# Patient Record
Sex: Male | Born: 1960 | Race: White | Hispanic: No | Marital: Single | Smoking: Current every day smoker
Health system: Southern US, Community
[De-identification: ages and names within clinical notes are randomized; demographics above are authoritative.]

## PROBLEM LIST (undated history)

## (undated) DIAGNOSIS — Z87442 Personal history of urinary calculi: Secondary | ICD-10-CM

## (undated) DIAGNOSIS — H3320 Serous retinal detachment, unspecified eye: Secondary | ICD-10-CM

## (undated) DIAGNOSIS — N200 Calculus of kidney: Secondary | ICD-10-CM

## (undated) DIAGNOSIS — I10 Essential (primary) hypertension: Secondary | ICD-10-CM

## (undated) HISTORY — DX: Essential (primary) hypertension: I10

## (undated) HISTORY — DX: Serous retinal detachment, unspecified eye: H33.20

## (undated) HISTORY — PX: COLONOSCOPY: SHX174

## (undated) HISTORY — PX: LITHOTRIPSY: SUR834

## (undated) HISTORY — DX: Calculus of kidney: N20.0

## (undated) HISTORY — PX: HERNIA REPAIR: SHX51

---

## 2002-11-13 ENCOUNTER — Encounter: Payer: Self-pay | Admitting: Emergency Medicine

## 2002-11-13 ENCOUNTER — Emergency Department (HOSPITAL_COMMUNITY): Admission: EM | Admit: 2002-11-13 | Discharge: 2002-11-13 | Payer: Self-pay | Admitting: Emergency Medicine

## 2008-11-14 ENCOUNTER — Ambulatory Visit (HOSPITAL_COMMUNITY): Admission: RE | Admit: 2008-11-14 | Discharge: 2008-11-14 | Payer: Self-pay | Admitting: Urology

## 2011-09-28 LAB — BASIC METABOLIC PANEL
BUN: 18
CO2: 30
Calcium: 9.5
Chloride: 106
Creatinine, Ser: 1.09
GFR calc Af Amer: >60
GFR calc non Af Amer: >60
Glucose, Bld: 108 — ABNORMAL HIGH
Potassium: 4.1
Sodium: 141

## 2012-06-14 ENCOUNTER — Encounter (HOSPITAL_COMMUNITY): Payer: Self-pay | Admitting: Emergency Medicine

## 2012-06-14 ENCOUNTER — Emergency Department (HOSPITAL_COMMUNITY): Payer: No Typology Code available for payment source

## 2012-06-14 ENCOUNTER — Inpatient Hospital Stay (HOSPITAL_COMMUNITY)
Admission: EM | Admit: 2012-06-14 | Discharge: 2012-06-19 | DRG: 083 | Disposition: A | Discharge status: Rehabilitation Facility | Payer: No Typology Code available for payment source | Attending: General Surgery | Admitting: General Surgery

## 2012-06-14 ENCOUNTER — Inpatient Hospital Stay (HOSPITAL_COMMUNITY): Payer: No Typology Code available for payment source

## 2012-06-14 DIAGNOSIS — S02109A Fracture of base of skull, unspecified side, initial encounter for closed fracture: Principal | ICD-10-CM | POA: Diagnosis present

## 2012-06-14 DIAGNOSIS — S06369A Traumatic hemorrhage of cerebrum, unspecified, with loss of consciousness of unspecified duration, initial encounter: Secondary | ICD-10-CM | POA: Diagnosis present

## 2012-06-14 DIAGNOSIS — R839 Unspecified abnormal finding in cerebrospinal fluid: Secondary | ICD-10-CM | POA: Diagnosis present

## 2012-06-14 DIAGNOSIS — S1093XA Contusion of unspecified part of neck, initial encounter: Secondary | ICD-10-CM

## 2012-06-14 DIAGNOSIS — I629 Nontraumatic intracranial hemorrhage, unspecified: Secondary | ICD-10-CM

## 2012-06-14 DIAGNOSIS — S065X9A Traumatic subdural hemorrhage with loss of consciousness of unspecified duration, initial encounter: Secondary | ICD-10-CM

## 2012-06-14 DIAGNOSIS — G9389 Other specified disorders of brain: Secondary | ICD-10-CM | POA: Diagnosis present

## 2012-06-14 DIAGNOSIS — S0003XA Contusion of scalp, initial encounter: Secondary | ICD-10-CM

## 2012-06-14 DIAGNOSIS — S0219XA Other fracture of base of skull, initial encounter for closed fracture: Secondary | ICD-10-CM

## 2012-06-14 DIAGNOSIS — F17200 Nicotine dependence, unspecified, uncomplicated: Secondary | ICD-10-CM | POA: Diagnosis present

## 2012-06-14 DIAGNOSIS — S12600A Unspecified displaced fracture of seventh cervical vertebra, initial encounter for closed fracture: Secondary | ICD-10-CM | POA: Diagnosis present

## 2012-06-14 DIAGNOSIS — R4182 Altered mental status, unspecified: Secondary | ICD-10-CM

## 2012-06-14 DIAGNOSIS — S41109A Unspecified open wound of unspecified upper arm, initial encounter: Secondary | ICD-10-CM

## 2012-06-14 DIAGNOSIS — T07XXXA Unspecified multiple injuries, initial encounter: Secondary | ICD-10-CM | POA: Diagnosis present

## 2012-06-14 DIAGNOSIS — S22009A Unspecified fracture of unspecified thoracic vertebra, initial encounter for closed fracture: Secondary | ICD-10-CM | POA: Diagnosis present

## 2012-06-14 DIAGNOSIS — S066X9A Traumatic subarachnoid hemorrhage with loss of consciousness of unspecified duration, initial encounter: Secondary | ICD-10-CM

## 2012-06-14 DIAGNOSIS — Y92410 Unspecified street and highway as the place of occurrence of the external cause: Secondary | ICD-10-CM

## 2012-06-14 DIAGNOSIS — E871 Hypo-osmolality and hyponatremia: Secondary | ICD-10-CM | POA: Diagnosis present

## 2012-06-14 DIAGNOSIS — S51009A Unspecified open wound of unspecified elbow, initial encounter: Secondary | ICD-10-CM | POA: Diagnosis present

## 2012-06-14 DIAGNOSIS — S0990XA Unspecified injury of head, initial encounter: Secondary | ICD-10-CM

## 2012-06-14 DIAGNOSIS — S0083XA Contusion of other part of head, initial encounter: Secondary | ICD-10-CM

## 2012-06-14 DIAGNOSIS — IMO0002 Reserved for concepts with insufficient information to code with codable children: Secondary | ICD-10-CM | POA: Diagnosis present

## 2012-06-14 LAB — DIFFERENTIAL
Lymphocytes Relative: 16 % (ref 12–46)
Lymphs Abs: 2.5 10*3/uL (ref 0.7–4.0)
Neutro Abs: 12 10*3/uL — ABNORMAL HIGH (ref 1.7–7.7)
Neutrophils Relative %: 78 % — ABNORMAL HIGH (ref 43–77)

## 2012-06-14 LAB — BASIC METABOLIC PANEL
CO2: 25 mEq/L (ref 19–32)
Chloride: 104 mEq/L (ref 96–112)
Glucose, Bld: 132 mg/dL — ABNORMAL HIGH (ref 70–99)
Potassium: 3.6 mEq/L (ref 3.5–5.1)
Sodium: 139 mEq/L (ref 135–145)

## 2012-06-14 LAB — URINE MICROSCOPIC-ADD ON

## 2012-06-14 LAB — RAPID URINE DRUG SCREEN, HOSP PERFORMED
Amphetamines: NOT DETECTED
Benzodiazepines: NOT DETECTED
Opiates: NOT DETECTED

## 2012-06-14 LAB — CBC
MCV: 90 fL (ref 78.0–100.0)
Platelets: 219 10*3/uL (ref 150–400)
RBC: 4.7 MIL/uL (ref 4.22–5.81)
WBC: 15.3 10*3/uL — ABNORMAL HIGH (ref 4.0–10.5)

## 2012-06-14 LAB — URINALYSIS, ROUTINE W REFLEX MICROSCOPIC
Glucose, UA: NEGATIVE mg/dL
Leukocytes, UA: NEGATIVE
Nitrite: NEGATIVE
Protein, ur: NEGATIVE mg/dL
pH: 7.5 (ref 5.0–8.0)

## 2012-06-14 LAB — MRSA PCR SCREENING: MRSA by PCR: NEGATIVE

## 2012-06-14 MED ORDER — DOCUSATE SODIUM 100 MG PO CAPS
100.0000 mg | ORAL_CAPSULE | Freq: Two times a day (BID) | ORAL | Status: DC
  Administered 2012-06-15 – 2012-06-19 (×8): 100 mg via ORAL
  Filled 2012-06-14 (×12): qty 1

## 2012-06-14 MED ORDER — POLYETHYLENE GLYCOL 3350 17 G PO PACK
17.0000 g | PACK | Freq: Every day | ORAL | Status: DC
  Administered 2012-06-15 – 2012-06-19 (×5): 17 g via ORAL
  Filled 2012-06-14 (×7): qty 1

## 2012-06-14 MED ORDER — IOHEXOL 300 MG/ML  SOLN
100.0000 mL | Freq: Once | INTRAMUSCULAR | Status: AC | PRN
  Administered 2012-06-14: 100 mL via INTRAVENOUS

## 2012-06-14 MED ORDER — ONDANSETRON HCL 4 MG/2ML IJ SOLN
INTRAMUSCULAR | Status: AC
  Administered 2012-06-14: 4 mg via INTRAVENOUS
  Filled 2012-06-14: qty 2

## 2012-06-14 MED ORDER — BACITRACIN-NEOMYCIN-POLYMYXIN OINTMENT TUBE
TOPICAL_OINTMENT | Freq: Every day | CUTANEOUS | Status: DC
  Administered 2012-06-14: 22:00:00 via TOPICAL
  Administered 2012-06-15 – 2012-06-16 (×2): 1 via TOPICAL
  Administered 2012-06-18: 10:00:00 via TOPICAL
  Filled 2012-06-14 (×2): qty 15

## 2012-06-14 MED ORDER — ONDANSETRON HCL 4 MG/2ML IJ SOLN
4.0000 mg | Freq: Four times a day (QID) | INTRAMUSCULAR | Status: DC | PRN
  Administered 2012-06-14 – 2012-06-17 (×2): 4 mg via INTRAVENOUS
  Filled 2012-06-14: qty 2

## 2012-06-14 MED ORDER — ONDANSETRON HCL 4 MG PO TABS: 4.0000 mg | ORAL_TABLET | Freq: Four times a day (QID) | ORAL | Status: DC | PRN

## 2012-06-14 MED ORDER — ACETAMINOPHEN 10 MG/ML IV SOLN
1000.0000 mg | Freq: Four times a day (QID) | INTRAVENOUS | Status: AC | PRN
  Administered 2012-06-14 – 2012-06-15 (×4): 1000 mg via INTRAVENOUS
  Filled 2012-06-14 (×3): qty 100

## 2012-06-14 MED ORDER — POTASSIUM CHLORIDE IN NACL 20-0.9 MEQ/L-% IV SOLN
INTRAVENOUS | Status: DC
  Administered 2012-06-14: 14:00:00 via INTRAVENOUS
  Administered 2012-06-15: 100 mL/h via INTRAVENOUS
  Administered 2012-06-15 – 2012-06-16 (×2): via INTRAVENOUS
  Administered 2012-06-17: 100 mL/h via INTRAVENOUS
  Administered 2012-06-17 – 2012-06-18 (×2): via INTRAVENOUS
  Filled 2012-06-14 (×13): qty 1000

## 2012-06-14 MED ORDER — CEFAZOLIN SODIUM 1-5 GM-% IV SOLN
1.0000 g | Freq: Three times a day (TID) | INTRAVENOUS | Status: DC
  Administered 2012-06-14 – 2012-06-18 (×11): 1 g via INTRAVENOUS
  Filled 2012-06-14 (×13): qty 50
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
## 2012-06-14 NOTE — ED Notes (Signed)
Kerry Castillo (daughter) [phone] or [phone]

## 2012-06-14 NOTE — ED Notes (Addendum)
Kerry Castillo [phone] pt sister

## 2012-06-14 NOTE — ED Notes (Signed)
See trauma notes

## 2012-06-14 NOTE — ED Notes (Signed)
Family updated as to patient's status.; pt placed in vista collar due to removing ccollar in CT
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
## 2012-06-14 NOTE — Progress Notes (Signed)
Patient has 4 to 5 cm laceration on his right elbow that appears to be deep and continually oozing small amounts of blood.  RN called Trauma team to report laceration might need stitches or staples.  Dr Lindie Spruce gave instructions to apply pressure dressing and Trauma would look at laceration in the morning.  Stephanie Coup, RN 06/14/2012  20:00
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
## 2012-06-15 ENCOUNTER — Inpatient Hospital Stay (HOSPITAL_COMMUNITY): Payer: No Typology Code available for payment source

## 2012-06-15 LAB — BASIC METABOLIC PANEL
GFR calc Af Amer: >90 mL/min (ref >90)
GFR calc non Af Amer: >90 mL/min (ref >90)
Potassium: 3.6 mEq/L (ref 3.5–5.1)
Sodium: 136 mEq/L (ref 135–145)

## 2012-06-15 LAB — CBC
Hemoglobin: 13.1 g/dL (ref 13.0–17.0)
MCHC: 35.8 g/dL (ref 30.0–36.0)
Platelets: 199 10*3/uL (ref 150–400)
RDW: 12.9 % (ref 11.5–15.5)

## 2012-06-15 MED ORDER — ACETAMINOPHEN 325 MG PO TABS
650.0000 mg | ORAL_TABLET | Freq: Four times a day (QID) | ORAL | Status: DC | PRN
  Administered 2012-06-15 – 2012-06-17 (×5): 650 mg via ORAL
  Filled 2012-06-15 (×5): qty 2

## 2012-06-15 MED ORDER — SODIUM CHLORIDE 0.9 % IV BOLUS (SEPSIS)
1000.0000 mL | Freq: Once | INTRAVENOUS | Status: AC
  Administered 2012-06-15: 1000 mL via INTRAVENOUS

## 2012-06-15 NOTE — Progress Notes (Signed)
Nursing 1400 Foley catheter did not produce any urine over 1 hour.  Bladder scan showed 33cc in bladder and RN irrigated catheter per order with NS 50cc.  Catheter did not produce urine after irrigation.  Dr. Janee Morn made aware that patient's catheter is not producing urine.  Order for NS 1L IV bolus.  Bolus started.  RN bladder scanned patient during bolus and found 220cc in bladder.  Dr. Janee Morn made aware.  Will continue bolus and not irrigated further at this time.  Will irrigate if needed s/p IV bolus.  Will continue to assess.
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
## 2012-06-15 NOTE — Progress Notes (Signed)
Nursing 1600 Upon assessment bladder catheter has no urine output.  Bladder scan revealed 421cc of urine in bladder.  NS bolus complete.  RN irrigated foley catheter with 120cc of NS per MD order.  Bladder catheter produced 495cc of urine after irrigation.  Will continue to assess.

## 2012-06-16 DIAGNOSIS — S02109A Fracture of base of skull, unspecified side, initial encounter for closed fracture: Secondary | ICD-10-CM | POA: Diagnosis present

## 2012-06-16 DIAGNOSIS — S065XAA Traumatic subdural hemorrhage with loss of consciousness status unknown, initial encounter: Secondary | ICD-10-CM | POA: Diagnosis present

## 2012-06-16 DIAGNOSIS — T07XXXA Unspecified multiple injuries, initial encounter: Secondary | ICD-10-CM | POA: Diagnosis present

## 2012-06-16 DIAGNOSIS — S065X9A Traumatic subdural hemorrhage with loss of consciousness of unspecified duration, initial encounter: Secondary | ICD-10-CM | POA: Diagnosis present

## 2012-06-16 DIAGNOSIS — S06369A Traumatic hemorrhage of cerebrum, unspecified, with loss of consciousness of unspecified duration, initial encounter: Secondary | ICD-10-CM | POA: Diagnosis present

## 2012-06-16 DIAGNOSIS — S069X9A Unspecified intracranial injury with loss of consciousness of unspecified duration, initial encounter: Secondary | ICD-10-CM

## 2012-06-16 LAB — BASIC METABOLIC PANEL
CO2: 23 mEq/L (ref 19–32)
GFR calc non Af Amer: >90 mL/min (ref >90)
Glucose, Bld: 112 mg/dL — ABNORMAL HIGH (ref 70–99)
Potassium: 3.6 mEq/L (ref 3.5–5.1)
Sodium: 132 mEq/L — ABNORMAL LOW (ref 135–145)

## 2012-06-16 LAB — CBC
Hemoglobin: 12.1 g/dL — ABNORMAL LOW (ref 13.0–17.0)
Platelets: 195 10*3/uL (ref 150–400)
RBC: 3.87 MIL/uL — ABNORMAL LOW (ref 4.22–5.81)
WBC: 14.1 10*3/uL — ABNORMAL HIGH (ref 4.0–10.5)
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
## 2012-06-16 NOTE — Plan of Care (Signed)
Problem: Phase III Progression Outcomes Goal: Ranchos Scale Level I - No Response Level II - Generalized Response Level III - Localized Response Level IV- Confused-Agitated Level V- Confused, Inappropriate, Non-Agitated Level VI- Confused-Appropriate Level VII- Automatic-Appropriate Level VIII- Purposeful-Appropriate  Outcome: Progressing Level V confused, inappropriate, non-agitated

## 2012-06-16 NOTE — Progress Notes (Signed)
I contacted pt's daughter, Aundra Millet, by phone. I will meet with 51 year old daughter and pt's sister, [name], on Monday at 11 am to discuss rehab venue options. Please call [phone] with questions.
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
## 2012-06-16 NOTE — Progress Notes (Signed)
Speech Language Pathology Treatment Patient Details Name: Kerry Castillo MRN: [id number] DOB: May 13, 1961 Today's Date: 06/16/2012 Time: 1191-4782 SLP Time Calculation (min): 20 min  Assessment / Plan / Recommendation Clinical Impression  Pt. seen in conjunction with TBI team (OT/PT).  Kerry Castillo exhibited increased expressive language difficutlies today with inconsistent accuracy with max verbal cueing.  Suspect pt. has more aphasia versus language of confusion and will continue to assess in diagnostic therapy.  Decreased sustanined attention due to headache and wanting to lie down.  Today pt. exhibited behaviors similar to Rancho V (confused/inappropriate/non-agitated) with some characteristics of Rancho VI (confused/appropriate)  Please see treatment section below for details of session    SLP Plan  Continue with current plan of care       SLP Goals  SLP Goals SLP Goal #1 - Progress: Progressing toward goal SLP Goal #2 - Progress: Progressing toward goal SLP Goal #3 - Progress: Progressing toward goal SLP Goal #4: Pt. will express basic thoughts and needs with mild phrase completion cues. SLP Goal #4 - Progress: Other (comment) (goal added 6/21)  General Behavior/Cognition: Alert;Confused;Distractible;Requires cueing  Oral Cavity - Oral Hygiene Does patient have any of the following "at risk" factors?: None of the above Brush patient's teeth BID with toothbrush (using toothpaste with fluoride): Yes   Treatment Treatment focused on: Cognition;Other (comment) (LANGUAGE FACILITATION) Skilled Treatment: Pt. seen today by brain injury team (PT/OT).  Pt. exhibiting increased difficulty with expressive language today with increased frequency of verbal perseverations and phonemic paraphasias with questionable neologisms.  Pt. correctly used common objects during ADL's (toothbrush, toothpaste, comb chapstick) but was unable to name them given phrase completion and phonemic cues.  He was  successful in stating first and last name given verbal phonemic cues.  Kerry Castillo was not oriented to place given choice of 2.  He accurately read one of three written words    Royce Macadamia M.Ed [employer] [phone]  06/16/2012
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
## 2012-06-16 NOTE — Plan of Care (Signed)
Problem: Phase III Progression Outcomes Goal: Progressing towards assisting with ADLs Outcome: Progressing Don socks, washed face, brushed teeth, combed hair, applied chapstick

## 2012-06-17 LAB — BASIC METABOLIC PANEL
BUN: 14 mg/dL (ref 6–23)
CO2: 24 mEq/L (ref 19–32)
Calcium: 8.8 mg/dL (ref 8.4–10.5)
Chloride: 96 mEq/L (ref 96–112)
Creatinine, Ser: 0.54 mg/dL (ref 0.50–1.35)
GFR calc Af Amer: >90 mL/min (ref >90)
GFR calc non Af Amer: >90 mL/min (ref >90)
Glucose, Bld: 95 mg/dL (ref 70–99)
Potassium: 3.7 mEq/L (ref 3.5–5.1)
Sodium: 130 mEq/L — ABNORMAL LOW (ref 135–145)

## 2012-06-17 MED ORDER — HYDROCODONE-ACETAMINOPHEN 10-325 MG PO TABS: 1.0000 | ORAL_TABLET | Freq: Four times a day (QID) | ORAL | Status: DC | PRN

## 2012-06-17 MED ORDER — HYDROCODONE-ACETAMINOPHEN 5-325 MG PO TABS
1.0000 | ORAL_TABLET | ORAL | Status: DC | PRN
  Administered 2012-06-17 (×2): 1 via ORAL
  Administered 2012-06-17: 2 via ORAL
  Administered 2012-06-17 – 2012-06-18 (×3): 1 via ORAL
  Administered 2012-06-18: 2 via ORAL
  Administered 2012-06-18: 1 via ORAL
  Administered 2012-06-18: 2 via ORAL
  Administered 2012-06-18 – 2012-06-19 (×2): 1 via ORAL
  Administered 2012-06-19: 2 via ORAL
  Administered 2012-06-19: 1 via ORAL
  Filled 2012-06-17: qty 2
  Filled 2012-06-17 (×4): qty 1
  Filled 2012-06-17: qty 2
  Filled 2012-06-17 (×2): qty 1
  Filled 2012-06-17 (×2): qty 2
  Filled 2012-06-17 (×3): qty 1

## 2012-06-17 NOTE — Progress Notes (Signed)
Patient ID: Kerry Castillo, male   DOB: 03/21/1961, 51 y.o.   MRN: [id number] Trauma Service Note  Subjective: The patient is more alert today according to family and nursing.    Objective: Vital signs in last 24 hours: Temp:  [97.6 F (36.4 C)-99.5 F (37.5 C)] 97.6 F (36.4 C) (06/22 0400) Pulse Rate:  [58-80] 72  (06/22 0800) Resp:  [8-21] 16  (06/22 0800) BP: (128-156)/(72-100) 140/87 mmHg (06/22 0800) SpO2:  [97 %-99 %] 99 % (06/22 0800) Last BM Date: 06/14/12  Intake/Output from previous day: 06/21 0701 - 06/22 0700 In: 2870 [P.O.:120; I.V.:2600; IV Piggyback:150] Out: 2430 [Urine:2430] Intake/Output this shift:    General: No acute distress, only some verbal interaction with me.    Lungs: Clear  CV:  RR&R  Abd: Soft, taking PO well, but not a lot, non distended.    Extremities: moving all extremities spontaneously.    Neuro: Followed some commands for me.    Lab Results: CBC   Basename 06/16/12 0430 06/15/12 0520  WBC 14.1* 15.6*  HGB 12.1* 13.1  HCT 34.1* 36.6*  PLT 195 199   BMET  Basename 06/16/12 0430 06/15/12 0520  NA 132* 136  K 3.6 3.6  CL 99 102  CO2 23 23  GLUCOSE 112* 128*  BUN 12 17  CREATININE 0.58 0.68  CALCIUM 8.7 8.8   PT/INR No results found for this basename: LABPROT:2,INR:2 in the last 72 hours ABG No results found for this basename: PHART:2,PCO2:2,PO2:2,HCO3:2 in the last 72 hours  Studies/Results: No results found.  Anti-infectives: Anti-infectives     Start     Dose/Rate Route Frequency Ordered Stop   06/14/12 2200   ceFAZolin (ANCEF) IVPB 1 g/50 mL premix        1 g 100 mL/hr over 30 Minutes Intravenous 3 times per day 06/14/12 2103            Assessment/Plan: TBI, C7 fx Overall clinical improvement.   D/C foley today.  Pt pulled with balloon up 3 days ago, was reinserted.  Will try to see if voids spontaneously. Add vicodin for pain control.  Now that more alert, will allow narcotics.   Checking BMET to  eval hyponatremia.   .Still needs ICU monitoring. Advance to full liquid diet.   Working toward rehab. Dispo from ICU per neurosurgery.   Getting repeat head CT in AM/    LOS: 3 days   \

## 2012-06-17 NOTE — Progress Notes (Signed)
Physical Therapy Treatment Patient Details Name: Kerry Castillo MRN: [id number] DOB: [birth year] Today's Date: 06/17/2012 Time: 0865-7846 PT Time Calculation (min): 29 min  PT Assessment / Plan / Recommendation Comments on Treatment Session  Pt repeatedly stating "I'm not right, this is not straight" pointing to head. Pt also pulls on cervical collar stating "And what are we going to do about this?" "I want this away." While sitting EOB pt able to ask "what are we going to do about this?" circling hand over groin area, when asked if he needed to urinate he stated "Yes" demonstrating improved ability to verbalize needs. Pt demonstrating significant short term memory deficits and impaired safety awareness throughout session. Pt did report that his head was spinning when asked however still with no nystagmus. Pt wonderful candidate for CIR.     Follow Up Recommendations  Inpatient Rehab;Supervision/Assistance - 24 hour       Equipment Recommendations  Defer to next venue    Recommendations for Other Services Rehab consult  Frequency Min 4X/week   Plan Discharge plan needs to be updated;Frequency remains appropriate    Precautions / Restrictions Precautions Precautions: Cervical;Fall Required Braces or Orthoses: Cervical Brace Cervical Brace: Hard collar;Other (comment) (at all times) Restrictions Weight Bearing Restrictions: No       Mobility  Bed Mobility Bed Mobility: Sitting - Scoot to Edge of Bed;Rolling Right;Right Sidelying to Sit Rolling Right: 4: Min assist Right Sidelying to Sit: 4: Min assist Sitting - Scoot to Edge of Bed: 5: Supervision Details for Bed Mobility Assistance: Pt responds best to single step cues, needs tactile cues for initiation. Pt very slow to process task at hand, unable to follow two step commands such as roll over and sit up.  Transfers Transfers: Sit to Stand;Stand to Sit Sit to Stand: 4: Min assist;With upper extremity assist;From bed Stand to  Sit: 4: Min guard;Without upper extremity assist;To chair/3-in-1 Details for Transfer Assistance: Lateral instability with initial stand, pt attempts to sit right away and requires cues to remember to stay standing to walk.  Ambulation/Gait Ambulation/Gait Assistance: 4: Min assist Ambulation Distance (Feet): 150 Feet (with one seated rest break) Assistive device: None (will at times hold onto IV pole for stability.) Ambulation/Gait Assistance Details: Pt able to determine Rt./Lt and turn appropriately. Pt again asking to return to bed however was able to postpone questions until near end of walk. Increased sway with fatigue, pt unable to verbalize when he needed to sit for rest and needed to be asked. Pt able to scan and count people in nurses station with one error, able to correct with verbal cues. +1 person to follow as pt with decreased ability to verbalize when fatigued. Gait Pattern: Narrow base of support Stairs: No      PT Goals Acute Rehab PT Goals Pt will go Supine/Side to Sit: with modified independence PT Goal: Supine/Side to Sit - Progress: Progressing toward goal Pt will go Sit to Stand: with modified independence PT Goal: Sit to Stand - Progress: Progressing toward goal Pt will go Stand to Sit: with modified independence PT Goal: Stand to Sit - Progress: Progressing toward goal Pt will Transfer Bed to Chair/Chair to Bed: with supervision PT Transfer Goal: Bed to Chair/Chair to Bed - Progress: Progressing toward goal Pt will Ambulate: >150 feet;with supervision;with least restrictive assistive device PT Goal: Ambulate - Progress: Progressing toward goal Pt will Go Up / Down Stairs: 1-2 stairs;with supervision;with least restrictive assistive device PT Goal: Up/Down Stairs - Progress: Progressing  toward goal Additional Goals Additional Goal #1: Pt will demonstrate ability to selectively attend to tasks/commands in a moderately distracting environment (hospital hallway) PT  Goal: Additional Goal #1 - Progress: Progressing toward goal  Visit Information  Last PT Received On: 06/17/12 Assistance Needed: +1 (+2 for safety)    Subjective Data  Subjective: Pt keeps stating "what are we going to do about this" and points to his cervical collar. Patient Stated Goal: Feel "straight" again   Cognition  Overall Cognitive Status: Impaired Area of Impairment: Attention;Memory;Following commands;Safety/judgement;Awareness of errors;Awareness of deficits;Problem solving;[place] level Arousal/Alertness: Awake/alert Orientation Level: Disoriented X4 Behavior During Session: Flat affect Current Attention Level: Sustained Memory Deficits: Pt repeats concern for why cervical brace donned, unable to remember explanation or what brace is. Short term memory significantly impaired. Following Commands: Follows one step commands with increased time Safety/Judgement: Decreased awareness of safety precautions;Decreased safety judgement for tasks assessed;Decreased awareness of need for assistance Safety/Judgement - Other Comments: Pt follows safety commands however requires these commands to be safe. Awareness of Errors: Assistance required to identify errors made;Assistance required to correct errors made    Balance  Balance Balance Assessed: Yes Static Standing Balance Static Standing - Balance Support: No upper extremity supported Static Standing - Level of Assistance: 5: Stand by assistance Static Standing - Comment/# of Minutes: Pt able to stand and urinate without, no overt losses of balance.  End of Session PT - End of Session Equipment Utilized During Treatment: Gait belt;Cervical collar Activity Tolerance: Other (comment) (Limited by decr attention span, concern about head and neck) Patient left: in chair (with sitter) Nurse Communication: Mobility status (\)    Wilhemina Bonito 06/17/2012, 5:22 PM  Sherie Don) Carleene Mains PT, DPT Acute  Rehabilitation [phone]

## 2012-06-17 NOTE — Progress Notes (Signed)
Subjective: Patient awakens more easily today. Overall more responsive.   Objective: Vital signs in last 24 hours: Filed Vitals:   06/17/12 0400 06/17/12 0500 06/17/12 0600 06/17/12 0700  BP: 128/76 156/93 135/83 154/99  Pulse: 62 60 68 58  Temp: 97.6 F (36.4 C)     TempSrc: Oral     Resp: 18  20 19   Height:      Weight:      SpO2: 97% 98% 97% 97%    Intake/Output from previous day: 06/21 0701 - 06/22 0700 In: 2870 [P.O.:120; I.V.:2600; IV Piggyback:150] Out: 2430 [Urine:2430] Intake/Output this shift:    Physical Exam:  Slowly oriented to name and hospital, but not to year. Following commands somewhat better today, but still difficulty with slightly less simple commands (such as holding up 2 fingers). Moving all 4 extremities well. Extraocular movements intact. Pupils equal round and reactive to light and approximately 3 mm bilaterally. Facial movements symmetrical.  CBC  Basename 06/16/12 0430 06/15/12 0520  WBC 14.1* 15.6*  HGB 12.1* 13.1  HCT 34.1* 36.6*  PLT 195 199   BMET  Basename 06/16/12 0430 06/15/12 0520  NA 132* 136  K 3.6 3.6  CL 99 102  CO2 23 23  GLUCOSE 112* 128*  BUN 12 17  CREATININE 0.58 0.68  CALCIUM 8.7 8.8    Assessment/Plan: BMET not checked this a.m. despite hyponatremia yesterday. We'll check BMET now and again a.m. Followup CT not done today, will order for a.m. Being considered for comprehensive inpatient rehabilitation, clearly an appropriate candidate for this. Spoke with the patient's sister [name] and patient's daughter Aundra Millet about his condition and progress. Their questions were answered.   Hewitt Shorts, MD 06/17/2012, 7:52 AM

## 2012-06-18 ENCOUNTER — Inpatient Hospital Stay (HOSPITAL_COMMUNITY): Payer: No Typology Code available for payment source

## 2012-06-18 LAB — BASIC METABOLIC PANEL
BUN: 14 mg/dL (ref 6–23)
CO2: 23 mEq/L (ref 19–32)
Calcium: 8.8 mg/dL (ref 8.4–10.5)
Chloride: 96 mEq/L (ref 96–112)
Creatinine, Ser: 0.6 mg/dL (ref 0.50–1.35)
GFR calc Af Amer: >90 mL/min (ref >90)
GFR calc non Af Amer: >90 mL/min (ref >90)
Glucose, Bld: 96 mg/dL (ref 70–99)
Potassium: 3.5 mEq/L (ref 3.5–5.1)
Sodium: 130 mEq/L — ABNORMAL LOW (ref 135–145)

## 2012-06-18 NOTE — Progress Notes (Signed)
Patient ID: Kerry Castillo, male   DOB: January 22, 1961, 51 y.o.   MRN: [id number] Patient looks better today. He is more awake and conversant. He has no pronator drift. No facial asymmetry. He states he has no headache today. No nausea and vomiting. No diplopia. He states he feels a lot better today than yesterday. He is oriented to person place and situation. CT scan today look stable with expected evolution of his bitemporal contusions. Basal cisterns are open. Overall I think he is doing better. He seems to be progressing nicely.
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
## 2012-06-19 ENCOUNTER — Inpatient Hospital Stay (HOSPITAL_COMMUNITY): Payer: No Typology Code available for payment source

## 2012-06-19 ENCOUNTER — Inpatient Hospital Stay (HOSPITAL_COMMUNITY)
Admission: RE | Admit: 2012-06-19 | Discharge: 2012-06-23 | DRG: 945 | Disposition: A | Discharge status: Home / Self Care | Payer: No Typology Code available for payment source | Source: Ambulatory Visit | Attending: Physical Medicine & Rehabilitation | Admitting: Physical Medicine & Rehabilitation

## 2012-06-19 DIAGNOSIS — H9210 Otorrhea, unspecified ear: Secondary | ICD-10-CM | POA: Diagnosis present

## 2012-06-19 DIAGNOSIS — S22009A Unspecified fracture of unspecified thoracic vertebra, initial encounter for closed fracture: Secondary | ICD-10-CM | POA: Diagnosis present

## 2012-06-19 DIAGNOSIS — Z5189 Encounter for other specified aftercare: Principal | ICD-10-CM

## 2012-06-19 DIAGNOSIS — E871 Hypo-osmolality and hyponatremia: Secondary | ICD-10-CM | POA: Diagnosis present

## 2012-06-19 DIAGNOSIS — Y998 Other external cause status: Secondary | ICD-10-CM

## 2012-06-19 DIAGNOSIS — S069X9A Unspecified intracranial injury with loss of consciousness of unspecified duration, initial encounter: Secondary | ICD-10-CM

## 2012-06-19 DIAGNOSIS — S02109A Fracture of base of skull, unspecified side, initial encounter for closed fracture: Secondary | ICD-10-CM | POA: Diagnosis present

## 2012-06-19 DIAGNOSIS — G809 Cerebral palsy, unspecified: Secondary | ICD-10-CM | POA: Diagnosis present

## 2012-06-19 DIAGNOSIS — S12600A Unspecified displaced fracture of seventh cervical vertebra, initial encounter for closed fracture: Secondary | ICD-10-CM | POA: Diagnosis present

## 2012-06-19 LAB — BASIC METABOLIC PANEL
CO2: 25 mEq/L (ref 19–32)
Calcium: 8.8 mg/dL (ref 8.4–10.5)
Creatinine, Ser: 0.62 mg/dL (ref 0.50–1.35)
GFR calc non Af Amer: >90 mL/min (ref >90)
Sodium: 130 mEq/L — ABNORMAL LOW (ref 135–145)

## 2012-06-19 MED ORDER — SORBITOL 70 % SOLN: 30.0000 mL | Freq: Every day | Status: DC | PRN

## 2012-06-19 MED ORDER — ONDANSETRON HCL 4 MG/2ML IJ SOLN: 4.0000 mg | Freq: Four times a day (QID) | INTRAMUSCULAR | Status: DC | PRN

## 2012-06-19 MED ORDER — ONDANSETRON HCL 4 MG PO TABS: 4.0000 mg | ORAL_TABLET | Freq: Four times a day (QID) | ORAL | Status: DC | PRN

## 2012-06-19 MED ORDER — SENNOSIDES-DOCUSATE SODIUM 8.6-50 MG PO TABS: 1.0000 | ORAL_TABLET | Freq: Every evening | ORAL | Status: DC | PRN

## 2012-06-19 MED ORDER — POLYETHYLENE GLYCOL 3350 17 G PO PACK
17.0000 g | PACK | Freq: Every day | ORAL | Status: DC
  Administered 2012-06-19 – 2012-06-23 (×5): 17 g via ORAL
  Filled 2012-06-19 (×6): qty 1

## 2012-06-19 MED ORDER — ACETAMINOPHEN 325 MG PO TABS
325.0000 mg | ORAL_TABLET | ORAL | Status: DC | PRN
Start: 2012-06-19 — End: 2012-06-23
  Administered 2012-06-23: 650 mg via ORAL
  Filled 2012-06-19: qty 2

## 2012-06-19 MED ORDER — BACITRACIN-NEOMYCIN-POLYMYXIN OINTMENT TUBE
TOPICAL_OINTMENT | Freq: Every day | CUTANEOUS | Status: DC
  Administered 2012-06-20 – 2012-06-23 (×4): via TOPICAL
  Filled 2012-06-19: qty 15

## 2012-06-19 MED ORDER — HYDROCODONE-ACETAMINOPHEN 5-325 MG PO TABS
1.0000 | ORAL_TABLET | ORAL | Status: DC | PRN
  Administered 2012-06-19 – 2012-06-20 (×3): 2 via ORAL
  Administered 2012-06-20: 1 via ORAL
  Administered 2012-06-21: 2 via ORAL
  Administered 2012-06-21: 1 via ORAL
  Administered 2012-06-21 (×2): 2 via ORAL
  Administered 2012-06-21 – 2012-06-22 (×3): 1 via ORAL
  Administered 2012-06-22: 2 via ORAL
  Filled 2012-06-19 (×5): qty 2
  Filled 2012-06-19 (×2): qty 1
  Filled 2012-06-19 (×2): qty 2
  Filled 2012-06-19 (×4): qty 1
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
## 2012-06-19 NOTE — Progress Notes (Signed)
I met with patient, daughter, and sister at bedside. Patient progressing well. I wait insurance decision on admitting patient to inpt rehab today. Patient and family in agreement. I have discussed with Trauma PA. I will follow up today as bed is available today. [phone] with questions.
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
## 2012-06-19 NOTE — Plan of Care (Signed)
Overall Plan of Care [facility]) Patient Details Name: Kerry Castillo MRN: [id number] DOB: 10-Nov-1961  Diagnosis:  TBI  Primary Diagnosis:    TBI (traumatic brain injury) Co-morbidities: hearing loss, skull fx  Functional Problem List  Patient demonstrates impairments in the following areas: Balance, Behavior, Bladder, Bowel, Cognition and Endurance  Basic ADL's: eating, grooming, bathing, dressing and toileting Advanced ADL's: simple meal preparation and laundry  Transfers:  bed to chair, car, furniture and floor Locomotion:  ambulation and stairs  Additional Impairments:  Leisure Awareness  Anticipated Outcomes Item Anticipated Outcome  Eating/Swallowing  Independent  Basic self-care  Intermittent supervision  Tolieting  Mod independence  Bowel/Bladder  Mod independence  Transfers  Mod I  Locomotion  Mod I 150'  Communication  Supervision  Cognition  Supervision  Pain  Min assist   Safety/Judgment  Supervision  Other     Therapy Plan: PT Frequency: 2-3 X/day, 60-90 minutes OT Frequency: 1-2 X/day, 60-90 minutes SLP Frequency: 1-2 X/day, 30-60 minutes   Team Interventions: Item RN PT OT SLP SW TR Other  Self Care/Advanced ADL Retraining   x      Neuromuscular Re-Education         Therapeutic Activities  x x x     UE/LE Strength Training/ROM  x x      UE/LE Coordination Activities  x x      Visual/Perceptual Remediation/Compensation         DME/Adaptive Equipment Instruction         Therapeutic Exercise  x x      Balance/Vestibular Training  x x      Patient/Family Education x x x x     Cognitive Remediation/Compensation  x x x     Functional Mobility Training  x x      Ambulation/Gait Training  x       Furniture conservator/restorer Reintegration  x x      Dysphagia/Aspiration Landscape architect Facilitation    x     Bladder Management  x        Bowel Management x        Disease Management/Prevention x        Pain Management x x x      Medication Management x        Skin Care/Wound Management x  x      Splinting/Orthotics         Discharge Planning  x x x x    Psychosocial Support    x x                       Team Discharge Planning: Destination:  Home Projected Follow-up:  OT, SLP and Outpatient Projected Equipment Needs:  None Patient/family involved in discharge planning:  Yes  MD ELOS: 5--7 days Medical Rehab Prognosis:  Excellent Assessment: Pt admitted for cir therapies. Goals are mod I to supervision. Team is addressing mobility, self-care, cognition, safety, swallowing. Communication. Family is quite involved
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
## 2012-06-19 NOTE — Progress Notes (Signed)
Insurance has approved and pt to be admitted to CIR today.

## 2012-06-19 NOTE — Progress Notes (Signed)
UR complete 

## 2012-06-19 NOTE — Progress Notes (Signed)
Report called to Angie, RN on 4000. Patient transferred to CIR with no signs of acute distress.

## 2012-06-19 NOTE — Progress Notes (Signed)
Subjective: Patient resting in bed comfortably. Denies complaints.  Objective: Vital signs in last 24 hours: Filed Vitals:   06/19/12 0400 06/19/12 0500 06/19/12 0600 06/19/12 0700  BP: 111/65 148/100 141/86 141/67  Pulse: 69 59 65 64  Temp: 98.2 F (36.8 C)     TempSrc: Oral     Resp: 12 15 14 12   Height:      Weight:      SpO2: 99% 96% 95% 100%    Intake/Output from previous day: 06/23 0701 - 06/24 0700 In: 1200 [I.V.:1200] Out: 1650 [Urine:1650] Intake/Output this shift:    Physical Exam:  Patient awake and alert, oriented to his name and 2013 but identified place as "[employer]."  Following commands with all 4 extremities. Pupils equal round and reactive to light. Extraocular movements intact. Face symmetrical.  BMET  Basename 06/19/12 0422 06/18/12 0451  NA 130* 130*  K 3.5 3.5  CL 97 96  CO2 25 23  GLUCOSE 101* 96  BUN 14 14  CREATININE 0.62 0.60  CALCIUM 8.8 8.8    Studies/Results: Ct Head Wo Contrast  06/18/2012  *RADIOLOGY REPORT*  Clinical Data: Traumatic intracranial hemorrhage and basilar skull fractures - follow-up.  CT HEAD WITHOUT CONTRAST  Technique:  Contiguous axial images were obtained from the base of the skull through the vertex without contrast.  Comparison: 06/15/2012  Findings: Bitemporal hemorrhagic contusions are unchanged, left greater than right, with moderate associated edema on the left. A 5 mm left frontal subdural hematoma, right middle cranial fossa subdural hematoma and blood layering on the tentorium are unchanged. Again noted is mass effect on the left lateral ventricle without midline shift. There is no evidence of hydrocephalus and the basilar cisterns are patent.  No new areas of hemorrhage or acute infarction are identified.  Basilar skull fractures are again identified involving the parietal, mastoid / temporal bones and sphenoid bones.  Fluid in the sphenoid sinuses and mastoid air cells again noted. Bilateral scalp hematomas are  again noted.  IMPRESSION: Stable head CT with unchanged intracranial hemorrhage and skull fractures.  Original Report Authenticated By: Rosendo Gros, M.D.    Assessment/Plan: Neurologically more responsive, more oriented, and overall improving neurologically. CT scan of the brain yesterday shows expected continued evolution of left temporal hemorrhagic contusion. There is persistence of thin left frontal and bilateral sub-temporal subdural hematomas. Significant hyponatremia persists, sodium 130 this morning. We'll change IV to saline lock. We'll need repeat CT brain without towards the end of the week. Continue PT and OT, awaiting CIR.   Hewitt Shorts, MD 06/19/2012, 7:21 AM

## 2012-06-20 DIAGNOSIS — S069X9A Unspecified intracranial injury with loss of consciousness of unspecified duration, initial encounter: Secondary | ICD-10-CM

## 2012-06-20 DIAGNOSIS — S069XAA Unspecified intracranial injury with loss of consciousness status unknown, initial encounter: Secondary | ICD-10-CM

## 2012-06-20 DIAGNOSIS — Z5189 Encounter for other specified aftercare: Secondary | ICD-10-CM

## 2012-06-20 LAB — DIFFERENTIAL
Basophils Absolute: 0 10*3/uL (ref 0.0–0.1)
Basophils Relative: 0 % (ref 0–1)
Eosinophils Absolute: 0.1 10*3/uL (ref 0.0–0.7)
Eosinophils Relative: 1 % (ref 0–5)
Lymphocytes Relative: 15 % (ref 12–46)

## 2012-06-20 LAB — CBC
Hemoglobin: 13.6 g/dL (ref 13.0–17.0)
MCH: 31.4 pg (ref 26.0–34.0)
MCV: 87.1 fL (ref 78.0–100.0)
RBC: 4.33 MIL/uL (ref 4.22–5.81)

## 2012-06-20 LAB — COMPREHENSIVE METABOLIC PANEL
ALT: 57 U/L — ABNORMAL HIGH (ref 0–53)
CO2: 28 mEq/L (ref 19–32)
Calcium: 9.3 mg/dL (ref 8.4–10.5)
GFR calc Af Amer: >90 mL/min (ref >90)
GFR calc non Af Amer: >90 mL/min (ref >90)
Glucose, Bld: 103 mg/dL — ABNORMAL HIGH (ref 70–99)
Sodium: 132 mEq/L — ABNORMAL LOW (ref 135–145)
Total Bilirubin: 2.4 mg/dL — ABNORMAL HIGH (ref 0.3–1.2)

## 2012-06-20 MED ORDER — ENSURE COMPLETE PO LIQD
237.0000 mL | Freq: Two times a day (BID) | ORAL | Status: DC
  Administered 2012-06-20 – 2012-06-22 (×5): 237 mL via ORAL

## 2012-06-20 NOTE — Progress Notes (Signed)
Physical Therapy Session Note  Patient Details  Name: ALFREDO SPONG MRN: [id number] Date of Birth: Mar 03, 1961  Today's Date: 06/20/2012 Time: 4098-1191 Time Calculation (min): 33 min  Short Term Goals: Week 1:  PT Short Term Goal 1 (Week 1): Same as LTGs  Skilled Therapeutic Interventions/Progress Updates:    Session focused on problem solving and fine motor tasks. Pt performed ambulation x >300' with supervision on unit while pushing wheelchair to "work" on it as well as retrieving parts and looking for replacement leg rests. Pt able to recall appropriate tools for task and find appropriate size (phillips head screw driver/ socket and handle) with 2 verbal cues. Pt requires min verbal cues for efficiency and problem solving through adjusting brakes and working on seat. Pt still with some word finding difficulties.     Therapy Documentation Precautions:  Precautions Precautions: Fall;Other (comment) (right elbow, staples, keep dry) Cervical Brace: Other (comment) (C-Collar discontinued) Restrictions Weight Bearing Restrictions: No Pain: Pain Assessment Pain Assessment: No/denies pain   See FIM for current functional status  Therapy/Group: Individual Therapy  Wilhemina Bonito 06/20/2012, 5:22 PM
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
## 2012-06-20 NOTE — Progress Notes (Signed)
Patient noted with bloody drainage on pillow this am. Left ear drainage noted .attempted to clean old dried drainage from left ear. Packed left ear with cotton to assess new drainage . No new drainage noted this pm . Right elbow wrapped per order staples intact . Bacitracin applied to abrasions to forehead. vicodin 2 managing pain in head . Family at bedside throughout day . Continue with plan of care .                      Kerry Castillo
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
## 2012-06-20 NOTE — Progress Notes (Signed)
Patient information reviewed and entered into [facility] system by Sabian Kuba, RN, CRRN, PPS Coordinator.  Information including medical coding and functional independence measure will be reviewed and updated through discharge.    

## 2012-06-20 NOTE — Care Management (Signed)
[facility] Individual Statement of Services  Patient Name:  Kerry Castillo  Date:  06/20/2012  Welcome to the [facility].  Our goal is to provide you with an individualized program based on your diagnosis and situation, designed to meet your specific needs.  With this comprehensive rehabilitation program, you will be expected to participate in at least 3 hours of rehabilitation therapies Monday-Friday, with modified therapy programming on the weekends.  Your rehabilitation program will include the following services:  Physical Therapy (PT), Occupational Therapy (OT), Speech Therapy (ST), 24 hour per day rehabilitation nursing, Therapeutic Recreaction (TR), Case Management (RN and Child psychotherapist), Rehabilitation Medicine, Nutrition Services and Pharmacy Services  Weekly team conferences will be held on  Tuesday  to discuss your progress.  Your RN Case Designer, television/film set will talk with you frequently to get your input and to update you on team discussions.  Team conferences with you and your family in attendance may also be held.  Expected length of stay: 5-7 days   Overall anticipated outcome: Supervision  Depending on your progress and recovery, your program may change.   Your RN Case Estate agent will coordinate services and will keep you informed of any changes.  Your RN Sports coach and SW names and contact numbers are listed  below.   The following services may also be recommended but are not provided by the [facility]:   Driving Evaluations  Home Health Rehabiltiation Services  Outpatient Rehabilitatation [facility]  Vocational Rehabilitation   Arrangements will be made to provide these services after discharge if needed.  Arrangements include referral to agencies that provide these services.  Your insurance has been verified to be:  BCBS Your primary doctor is:    Pertinent information will be  shared with your doctor and your insurance company.  Case Manager: Melanee Spry, [facility] [phone]  Social Worker:  [place], [place] [phone]  Information discussed with and copy given to patient by: Brock Ra, 06/20/2012, 12:26 PM
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
## 2012-06-21 MED ORDER — TETRAHYDROZOLINE HCL 0.05 % OP SOLN
2.0000 [drp] | Freq: Three times a day (TID) | OPHTHALMIC | Status: DC
  Administered 2012-06-21 (×2): 2 [drp] via OPHTHALMIC
  Filled 2012-06-21: qty 15

## 2012-06-21 NOTE — Progress Notes (Signed)
Occupational Therapy Session Note  Patient Details  Name: CASMERE HOLLENBECK MRN: [id number] Date of Birth: 28-Jan-1961  Today's Date: 06/21/2012 Time: 8295-6213 Time Calculation (min):   Skilled Therapeutic Interventions/Progress Updates:    Pt engaged in ADL retraining (bathing, dressing, toileting, grooming) with focus on safety awareness and dynamic standing balance.  Pt initiated and sequenced all tasks independently.  Pt donned underpants and pants while standing exhibiting LOB but able to self-correct.  Pt stated that he should have sat down to thread his pants legs.  Pt completed bathing while standing in shower with no LOB noted.  Pt completed all grooming tasks while standing at sink.  Therapy Documentation Precautions:  Precautions Precautions: Fall;Other (comment) (right elbow, staples, keep dry) Cervical Brace: Other (comment) (C-Collar discontinued) Restrictions Weight Bearing Restrictions: No   Pain:  Pt denies pain  See FIM for current functional status  Therapy/Group: Individual Therapy  Rich Brave 06/21/2012, 12:21 PM

## 2012-06-21 NOTE — Progress Notes (Signed)
Speech Language Pathology Daily Session Note  Patient Details  Name: Kerry Castillo MRN: [id number] Date of Birth: 10/05/1961  Today's Date: 06/21/2012 Time: 1330-1430 Time Calculation (min): 60 min  Short Term Goals: Week 1: SLP Short Term Goal 1 (Week 1): Pt will demonstrate selective attention during functional and familiar tasks with supervision verbal cues for redirection. SLP Short Term Goal 2 (Week 1): Pt will utilize external memory aids to increse recall and carryover of newly learned information with Min verbal and questioning cues.  SLP Short Term Goal 3 (Week 1): Pt will demosntrate emergent awareness of word-finding difficulties and utilize strategies with supervision verbal and questioning cues.  SLP Short Term Goal 4 (Week 1): Pt will utilize call bell to ask for assistance to increase safety with supervision questioning cues.   Skilled Therapeutic Interventions: Treatment focus on intellectual awareness, working memory and word-finding strategies. Pt required Mod A semantic and questioning cues for intellectual awareness into cognitive-linguistic deficits. Pt with increased recall of morning events and utilized his schedule to anticipate next therapy with supervision questioning cues. Initiated use of description as a strategy to utilize with communication partners to increase word-finding. Pt required Mod semantic and questioning cues to appropriately demonstrate strategy (pt is too broad in description and required cueing to increase specificity).    FIM:  Comprehension Comprehension Mode: Auditory Comprehension: 5-Follows basic conversation/direction: With extra time/assistive device Expression Expression: 5-Expresses basic needs/ideas: With extra time/assistive device Social Interaction Social Interaction: 5-Interacts appropriately 90% of the time - Needs monitoring or encouragement for participation or interaction. Problem Solving Problem Solving: 4-Solves basic 75 -  89% of the time/requires cueing 10 - 24% of the time Memory Memory: 3-Recognizes or recalls 50 - 74% of the time/requires cueing 25 - 49% of the time FIM - Eating Eating Activity: 6: Swallowing techniques: self-managed  Pain Pain Assessment Pain Assessment: 0-10 Pain Score:   2 Pain Type: Acute pain Pain Location: Head Pain Orientation: Left;Posterior Pain Descriptors: Aching Pain Onset: Gradual Patients Stated Pain Goal: 2 Pain Intervention(s): Medication (See eMAR) Multiple Pain Sites: No  Therapy/Group: Individual Therapy  Virginia Curl 06/21/2012, 5:05 PM

## 2012-06-21 NOTE — Progress Notes (Signed)
Social Work Patient ID: Kerry Castillo, male   DOB: 04/11/1961, 51 y.o.   MRN: [id number]  Met with patient and with sister today to review d/c planning needs.  Both aware and agreeable with targeted d/c 6/28 with family to provide 24/7 supervision.  Agree with OP f/u at [facility].  Have assisted with FMLA and STD paperwork.  Will continue to follow.  Tru Leopard

## 2012-06-21 NOTE — Progress Notes (Signed)
Physical Therapy Note  Patient Details  Name: Kerry Castillo MRN: [id number] Date of Birth: 05-19-1961 Today's Date: 06/21/2012  1000-1055 (55 minutes) individual Pain: no complaint of pain Precautions : quick release belt in chair Focus of treatment: Therapeutic exercises for general strengthening/endurance; Therapeutic exercises focused on word finding/recall to improve short term memory Treatment: Gait - supervision on unit; Nustep Level 5 LEs only for general strengthening/endurance X 10 minutes; Gait while using letter ball to identify foods (divided attention) from first letter. Pt required mod cues to identify a food based on first letter and could not recall previous food selection (decreased short term memory).  1530-1555 (25 minutes) individual Pain: no complaint of pain Focus of treatment: Therapeutic activities focused on improving short term memory/recall Treatment: Pt given first letters of objects (foods) from AM session. Pt recalled approximately 50% without cueing; pt given words related to familiar tools with letters missing - pt required mod/max cues to complete words ( wrench,hammer, drill).  Zohar Laing,JIM 06/21/2012, 10:58 AM
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
## 2012-06-22 NOTE — Progress Notes (Signed)
Occupational Therapy Discharge Summary  Patient Details  Name: Kerry Castillo MRN: [id number] Date of Birth: 09-16-1961  Today's Date: 06/22/2012   Patient has met 14 of 14 long term goals due to improved activity tolerance, improved balance, improved attention and improved awareness.  Pt is independent for bathing, dressing, grooming, toilet transfers, toileting, and shower transfers.  Pt demonstrates alternating attention with supervision during functional tasks and demonstrates anticipatory awareness with questioning cues.  Pt is able to complete all tasks without verbal cues for sequencing or initiation.  Pt's sister and dtr have been present for therapy sessions and are pleased with progress.  Discussed recommendation for 24 hour supervision after discharge. Patient to discharge at overall Supervision level.  Patient's care partner is independent to provide the necessary cognitive assistance at discharge.    Reasons goals not met: NA  Recommendation:  Patient will benefit from ongoing skilled OT services in outpatient setting to continue to advance functional skills in the area of iADL.  Equipment: No equipment provided  Reasons for discharge: treatment goals met and discharge from hospital  Patient/family agrees with progress made and goals achieved: Yes  OT Discharge   ADL ADL Eating: Independent Grooming: Independent Where Assessed-Grooming: Standing at sink Upper Body Bathing: Independent Where Assessed-Upper Body Bathing: Shower Lower Body Bathing: Independent Where Assessed-Lower Body Bathing: Shower Upper Body Dressing: Independent Where Assessed-Upper Body Dressing: Edge of bed Lower Body Dressing: Independent Where Assessed-Lower Body Dressing: Edge of bed Toileting: Independent Where Assessed-Toileting: Teacher, adult education: Community education officer Method: Event organiser: Psychologist, counselling Method:  Ambulating Vision/Perception  Vision - History Baseline Vision:  (wears contacts) Vision - Naval architect: Within Functional Limits Vision Assessment: Vision not tested Perception Perception: Within Functional Limits Praxis Praxis: Intact  Cognition Overall Cognitive Status: Impaired Arousal/Alertness: Awake/alert Orientation Level: Oriented X4 Attention: Alternating Sustained Attention: Appears intact Sustained Attention Impairment: Functional basic;Verbal basic Selective Attention: Appears intact Selective Attention Impairment: Verbal basic;Functional basic Alternating Attention: Impaired Alternating Attention Impairment: Verbal basic;Functional basic Sensation Sensation Light Touch: Appears Intact Stereognosis: Appears Intact Hot/Cold: Appears Intact Proprioception: Appears Intact Coordination Gross Motor Movements are Fluid and Coordinated: Yes Fine Motor Movements are Fluid and Coordinated: Yes Motor  Motor Motor: Within Functional Limits Mobility     Trunk/Postural Assessment  Cervical Assessment Cervical Assessment: Within Functional Limits Thoracic Assessment Thoracic Assessment: Within Functional Limits Lumbar Assessment Lumbar Assessment: Within Functional Limits Postural Control Postural Control: Within Functional Limits  Balance   Extremity/Trunk Assessment RUE Assessment RUE Assessment: Within Functional Limits LUE Assessment LUE Assessment: Within Functional Limits  See FIM for current functional status  Rich Brave 06/22/2012, 3:20 PM
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
## 2012-06-22 NOTE — Progress Notes (Signed)
Physical Therapy Session Note  Patient Details  Name: Kerry Castillo MRN: [id number] Date of Birth: 01/05/1961  Today's Date: 06/22/2012 Time: 1032-1057 Time Calculation (min): 25 min  Short Term Goals: Week 1:  PT Short Term Goal 1 (Week 1): Same as LTGs  Skilled Therapeutic Interventions/Progress Updates:    Gait on unit x > 500' modified independent. Community gait x >500' performed modified independent over uneven terrain (brick/uneven pavement/grass), ramps and curb step.  Pt able to perform directional negotiation of hospital with min verbal cues, some loss of memory noted. Home environment ambulation x 150' modified independent in kitchen, bedroom, and bathroom environments. 2 x 12 steps modified independent without use of rail.  Dynamic Gait Index score = 24/24 indicating very low risk for falls in community. Pt made modified independent in room, explained conditions to pt and advised pt to take caution when by self.    Therapy Documentation Precautions:  Precautions Precautions: Fall;Other (comment) (right elbow, staples, keep dry) Cervical Brace: Other (comment) (C-Collar discontinued) Restrictions Weight Bearing Restrictions: No Vital Signs: Therapy Vitals Temp: 99 F (37.2 C) Temp src: Oral Pulse Rate: 84  Resp: 20  BP: 117/65 mmHg Patient Position, if appropriate: Lying Oxygen Therapy SpO2: 97 % O2 Device: None (Room air) Pain: Pain Assessment Pain Assessment: No/denies pain  See FIM for current functional status  Therapy/Group: Individual Therapy  Wilhemina Bonito 06/22/2012, 5:53 PM

## 2012-06-22 NOTE — Progress Notes (Signed)
Occupational Therapy Session Note  Patient Details  Name: Kerry Castillo MRN: [id number] Date of Birth: Apr 09, 1961  Today's Date: 06/22/2012 Time: 1191-4782 Time Calculation (min): 45 min   Skilled Therapeutic Interventions/Progress Updates:    Pt engaged in bathing at shower level and dressing with sit to stand from EOB.  Pt amb wihout AD around room to gather clothing and supplies and to sink to complete grooming tasks while standing.  Pt completed bathing tasks while standing in shower.  After dressing patient picked up towels off floor and placed them in dirty linen bag.  Pt also straightened up bed and returned supplies to drawers.  Pt completed all tasks independently with no unsafe behavior noted but patient continually asked therapist if he was doing good as well as informing therapist of his actions before performing task..  Recommended to dtr and sister that patient have 24 hour supervision for safety.  Focus on safety awareness and activity tolerance.  Pt observed that he was doing better today than the previous day.  Therapy Documentation Precautions:  Precautions Precautions: Fall;Other (comment) (right elbow, staples, keep dry) Cervical Brace: Other (comment) (C-Collar discontinued) Restrictions Weight Bearing Restrictions: No   Pain: Pain Assessment Pain Assessment: No/denies pain  See FIM for current functional status  Therapy/Group: Individual Therapy  Rich Brave 06/22/2012, 10:46 AM

## 2012-06-22 NOTE — Progress Notes (Signed)
Speech Language Pathology Daily Session Note  Patient Details  Name: Kerry Castillo MRN: [id number] Date of Birth: 31-Dec-1960  Today's Date: 06/22/2012 Time: 1330-1430 Time Calculation (min): 60 min  Short Term Goals: Week 1: SLP Short Term Goal 1 (Week 1): Pt will demonstrate selective attention during functional and familiar tasks with supervision verbal cues for redirection. SLP Short Term Goal 2 (Week 1): Pt will utilize external memory aids to increse recall and carryover of newly learned information with Min verbal and questioning cues.  SLP Short Term Goal 3 (Week 1): Pt will demosntrate emergent awareness of word-finding difficulties and utilize strategies with supervision verbal and questioning cues.  SLP Short Term Goal 4 (Week 1): Pt will utilize call bell to ask for assistance to increase safety with supervision questioning cues.   Skilled Therapeutic Interventions: Treatment focus on working memory with utilization of compensatory strategies, emergent awareness and word-finding. Pt participated in a working memory game and required [name] questioning and semantic cues to utilize the strategy of "association" to form relationships to increase recall of specific categories. Pt also required Mod A semantic and phonemic cues to generate items within a specific category. Pt demonstrated emergent awareness in regards to decreased working memory and pt educated on compensatory strategies he can utilize at home. Pt also given handout educating pt on exercises he can perform independently to increase lingual and labial strength and ROM.    FIM:  Comprehension Comprehension Mode: Auditory Comprehension: 5-Follows basic conversation/direction: With extra time/assistive device Expression Expression: 5-Expresses basic needs/ideas: With extra time/assistive device Social Interaction Social Interaction: 6-Interacts appropriately with others with medication or extra time (anti-anxiety,  antidepressant). Problem Solving Problem Solving: 4-Solves basic 75 - 89% of the time/requires cueing 10 - 24% of the time Memory Memory: 3-Recognizes or recalls 50 - 74% of the time/requires cueing 25 - 49% of the time  Pain Pain Assessment Pain Assessment: No/denies pain  Therapy/Group: Individual Therapy  [name] 06/22/2012, 4:18 PM

## 2012-06-22 NOTE — Progress Notes (Addendum)
Physical Therapy Discharge Summary  Patient Details  Name: Kerry Castillo MRN: [id number] Date of Birth: 09/22/1961  Today's Date: 06/22/2012 Time: 4098-1191 Time Calculation (min): 55 min  Skilled Therapeutic Interventions/Progress Updates:  Pt performing car transfer and floor transfer independently. All gait now independent level. Pt participating in high level gait activities in coordination with cognitive challenges such as ambulating while tossing lettered ball then stating word that started with that letter to work on multitasking and word finding capabilities. Performed obstacle courses with negotiation while dribbling basketball, walking over compliant and uneven surface, and stepping over large objects to facilitate high level balance and cognitive challenge. Took pt on contorted route to opposite end of hospital, pt given floor number he was staying on and had to find his way back to his room, pt able to perform with 1 error, able to determine he made a wrong turn - turned and corrected path without verbal cues. High level balance standing on BOSU ball with cognitive task of writing categories of words on mirror. Pt reports he has had no problems with being modified independent in room and has no further questions. Word finding difficulties appear to be pt's most evident impairment.   Patient has met 9 of 9 long term goals due to improved activity tolerance, improved balance, ability to compensate for deficits, improved attention and improved awareness.  Patient to discharge at an ambulatory level Modified Independent.   Patient's care partner able to provide the necessary physical assistance at discharge.  Reasons goals not met: NA  Recommendation:  Patient will not benefit from ongoing skilled PT services.  Equipment: No equipment provided  Reasons for discharge: treatment goals met and discharge from hospital  Patient/family agrees with progress made and goals achieved:  Yes  PT Discharge Precautions/Restrictions  None Vital Signs Therapy Vitals Temp: 99 F (37.2 C) Temp src: Oral Pulse Rate: 84  Resp: 20  BP: 117/65 mmHg Patient Position, if appropriate: Lying Oxygen Therapy SpO2: 97 % O2 Device: None (Room air) Pain Pain Assessment Pain Assessment: No/denies pain Vision/Perception  Vision - History Baseline Vision:  (wears contacts) Vision - Assessment Eye Alignment: Within Functional Limits Vision Assessment: Vision not tested Perception Perception: Within Functional Limits Praxis Praxis: Intact  Cognition Overall Cognitive Status: Impaired Arousal/Alertness: Awake/alert Orientation Level: Oriented X4 Attention: Alternating Sustained Attention: Appears intact Sustained Attention Impairment: Functional basic;Verbal basic Selective Attention: Appears intact Selective Attention Impairment: Verbal basic;Functional basic Alternating Attention: Impaired Alternating Attention Impairment: Verbal basic;Functional basic Decreased Short Term Memory: Verbal basic Problem Solving Impairment: Verbal complex Sensation Sensation Light Touch: Appears Intact Stereognosis: Appears Intact Hot/Cold: Appears Intact Proprioception: Appears Intact Coordination Gross Motor Movements are Fluid and Coordinated: Yes Fine Motor Movements are Fluid and Coordinated: Yes Motor  Motor Motor: Within Functional Limits  Mobility Bed Mobility Bed Mobility: Supine to Sit Rolling Right: 7: Independent Supine to Sit: 7: Independent Sit to Supine: 7: Independent Transfers Sit to Stand: 7: Independent Stand to Sit: 7: Independent Locomotion  Ambulation Ambulation: Yes Ambulation/Gait Assistance: 7: Independent Ambulation Distance (Feet): 1000 Feet (at least) Assistive device: None Gait Gait: Yes Gait Pattern: Within Functional Limits High Level Ambulation High Level Ambulation: Side stepping;Backwards walking;Direction changes;Sudden stops;Head  turns Stairs / Additional Locomotion Stairs: Yes Stairs Assistance: 6: Modified independent (Device/Increase time) Stair Management Technique: No rails Number of Stairs: 24  Height of Stairs: 6  (inches) Ramp: 6: Modified independent (Device) Curb: 6: Modified independent (Device/increase time)  Trunk/Postural Assessment  Cervical Assessment Cervical Assessment: Within  Functional Limits Thoracic Assessment Thoracic Assessment: Within Functional Limits Lumbar Assessment Lumbar Assessment: Within Functional Limits Postural Control Postural Control: Within Functional Limits  Balance Dynamic Gait Index Level Surface: Normal Change in Gait Speed: Normal Gait with Horizontal Head Turns: Normal Gait with Vertical Head Turns: Normal Gait and Pivot Turn: Normal Step Over Obstacle: Normal Step Around Obstacles: Normal Steps: Normal Total Score: 24  Extremity Assessment  RUE Assessment RUE Assessment: Within Functional Limits LUE Assessment LUE Assessment: Within Functional Limits RLE Assessment RLE Assessment: Within Functional Limits LLE Assessment LLE Assessment: Within Functional Limits  See FIM for current functional status      Wilhemina Bonito 06/22/2012, 6:06 PM

## 2012-06-22 NOTE — Discharge Summary (Signed)
  Discharge summary job [id number]

## 2012-06-23 DIAGNOSIS — S069X9A Unspecified intracranial injury with loss of consciousness of unspecified duration, initial encounter: Secondary | ICD-10-CM

## 2012-06-23 DIAGNOSIS — Z5189 Encounter for other specified aftercare: Secondary | ICD-10-CM

## 2012-06-23 MED ORDER — HYDROCODONE-ACETAMINOPHEN 5-325 MG PO TABS: 1.0000 | ORAL_TABLET | ORAL | Status: AC | PRN

## 2012-06-23 MED ORDER — TETRAHYDROZOLINE HCL 0.05 % OP SOLN: 2.0000 [drp] | Freq: Three times a day (TID) | OPHTHALMIC | Status: AC

## 2012-06-23 NOTE — Discharge Instructions (Signed)
Inpatient Rehab Discharge Instructions  Kerry Castillo Discharge date and time: No discharge date for patient encounter.   Activities/Precautions/ Functional Status: Activity: activity as tolerated Diet: regular diet Wound Care: none needed Functional status:  ___ No restrictions     ___ Walk up steps independently __x_ 24/7 supervision/assistance   __x_ Walk up steps with assistance ___ Intermittent supervision/assistance  ___ Bathe/dress independently ___ Walk with walker     ___ Bathe/dress with assistance ___ Walk Independently    ___ Shower independently _x__ Walk with assistance    ___ Shower with assistance ___ No alcohol     ___ Return to work/school ________    COMMUNITY REFERRALS UPON DISCHARGE:    Outpatient: [facility]  @ [facility]  (directions attached)  Ph [phone]                                                    1st appointment:  06/28/12 @ 9:45 (please arrive @ 9:15)   GENERAL COMMUNITY RESOURCES FOR PATIENT/FAMILY:  Support Groups: [place] [facility]  Employment Assistance: Diplomatic Services operational officer (info provided) Caregiver Support: same    Special Instructions: No driving   My questions have been answered and I understand these instructions. I will adhere to these goals and the provided educational materials after my discharge from the hospital.  Patient/Caregiver Signature _______________________________ Date __________  Clinician Signature _______________________________________ Date __________  Please bring this form and your medication list with you to all your follow-up doctor's appointments.
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
## 2012-06-23 NOTE — Progress Notes (Signed)
Patient discharged home today, Discharge instructions given by Nickola Major, PA.

## 2012-06-23 NOTE — Progress Notes (Signed)
Social Work  Discharge Note  The overall goal for the admission was met for:   Discharge location: Yes - home with daughter and family to provide 24/7 supervision  Length of Stay: Yes - 4 days  Discharge activity level: Yes - supervision overall  Home/community participation: Yes  Services provided included: MD, RD, PT, OT, SLP, RN, CM, TR, Pharmacy and SW  Financial Services: Private Insurance: BCBS  Follow-up services arranged: Outpatient: [facility] via [facility] and Other: Provided info on local support group, [facility] and VR  Comments (or additional information):  Patient/Family verbalized understanding of follow-up arrangements: Yes  Individual responsible for coordination of the follow-up plan: patient  Confirmed correct DME delivered: NA  Kerry Castillo

## 2012-06-23 NOTE — Progress Notes (Signed)
Speech Language Pathology Discharge Summary & Session Note  Patient Details  Name: Kerry Castillo MRN: [id number] Date of Birth: 18-Dec-1961  Today's Date: 06/23/2012 Time: 4098-1191 Time Calculation (min): 15 min  Skilled Therapeutic Intervention: Treatment focus on pt/family education in regards to word-finding strategies and strategies to utilize at home to maximize cognitive function. Handouts given and pt's sister verbalized and demonstrated understanding.   Patient has met 4 of 4 long term goals.  Patient to discharge at overall Supervision level.   Reasons goals not met: N/A   Clinical Impression/Discharge Summary: Pt has made functional gains and has met all goals this admission. Currently, pt is demonstrating behaviors  consistent with a Rancho Level VII and is overall supervision for working memory, emergent awareness, and judgment.  Pt also requires supervision semantic cues to increase word-finding during functional conversation. Pt/family education complete and recommend pt requires 24 hour supervision and f/u outpatient SLP intervention.   Care Partner:  Caregiver Able to Provide Assistance: Yes  Type of Caregiver Assistance: Cognitive  Recommendation:  Outpatient SLP  Rationale for SLP Follow Up: Maximize cognitive function and independence;Maximize functional communication   Equipment: N/A   Reasons for discharge: Treatment goals met;Discharged from hospital   See FIM for current functional status  Roderick Calo 06/23/2012, 3:58 PM

## 2012-06-28 ENCOUNTER — Ambulatory Visit: Payer: BC Managed Care – PPO | Attending: Physical Medicine & Rehabilitation

## 2012-06-28 DIAGNOSIS — Z5189 Encounter for other specified aftercare: Secondary | ICD-10-CM | POA: Insufficient documentation

## 2012-06-28 DIAGNOSIS — M2550 Pain in unspecified joint: Secondary | ICD-10-CM | POA: Insufficient documentation

## 2012-06-28 DIAGNOSIS — I69998 Other sequelae following unspecified cerebrovascular disease: Secondary | ICD-10-CM | POA: Insufficient documentation

## 2012-06-28 DIAGNOSIS — M6281 Muscle weakness (generalized): Secondary | ICD-10-CM | POA: Insufficient documentation

## 2012-06-28 DIAGNOSIS — I69919 Unspecified symptoms and signs involving cognitive functions following unspecified cerebrovascular disease: Secondary | ICD-10-CM | POA: Insufficient documentation

## 2012-07-03 ENCOUNTER — Encounter: Payer: BC Managed Care – PPO | Admitting: *Deleted

## 2012-07-05 ENCOUNTER — Encounter: Payer: BC Managed Care – PPO | Admitting: *Deleted

## 2012-07-12 ENCOUNTER — Encounter: Payer: BC Managed Care – PPO | Admitting: Speech Pathology

## 2012-07-14 ENCOUNTER — Encounter: Payer: BC Managed Care – PPO | Admitting: *Deleted

## 2012-07-14 ENCOUNTER — Encounter: Payer: Self-pay | Admitting: Physical Medicine & Rehabilitation

## 2012-07-14 ENCOUNTER — Encounter
Payer: No Typology Code available for payment source | Attending: Physical Medicine & Rehabilitation | Admitting: Physical Medicine & Rehabilitation

## 2012-07-14 ENCOUNTER — Other Ambulatory Visit (HOSPITAL_COMMUNITY): Payer: Self-pay | Admitting: Neurosurgery

## 2012-07-14 VITALS — BP 134/93 | HR 83 | Ht 69.0 in | Wt 179.0 lb

## 2012-07-14 DIAGNOSIS — S065X9A Traumatic subdural hemorrhage with loss of consciousness of unspecified duration, initial encounter: Secondary | ICD-10-CM

## 2012-07-14 DIAGNOSIS — S02109A Fracture of base of skull, unspecified side, initial encounter for closed fracture: Secondary | ICD-10-CM

## 2012-07-14 DIAGNOSIS — G510 Bell's palsy: Secondary | ICD-10-CM

## 2012-07-14 DIAGNOSIS — X58XXXA Exposure to other specified factors, initial encounter: Secondary | ICD-10-CM | POA: Insufficient documentation

## 2012-07-14 DIAGNOSIS — S12600A Unspecified displaced fracture of seventh cervical vertebra, initial encounter for closed fracture: Secondary | ICD-10-CM | POA: Insufficient documentation

## 2012-07-14 DIAGNOSIS — G519 Disorder of facial nerve, unspecified: Secondary | ICD-10-CM

## 2012-07-14 DIAGNOSIS — G4700 Insomnia, unspecified: Secondary | ICD-10-CM | POA: Insufficient documentation

## 2012-07-14 DIAGNOSIS — S22009A Unspecified fracture of unspecified thoracic vertebra, initial encounter for closed fracture: Secondary | ICD-10-CM | POA: Insufficient documentation

## 2012-07-14 DIAGNOSIS — S065XAA Traumatic subdural hemorrhage with loss of consciousness status unknown, initial encounter: Secondary | ICD-10-CM

## 2012-07-14 DIAGNOSIS — S069XAA Unspecified intracranial injury with loss of consciousness status unknown, initial encounter: Secondary | ICD-10-CM

## 2012-07-14 DIAGNOSIS — S069X9A Unspecified intracranial injury with loss of consciousness of unspecified duration, initial encounter: Secondary | ICD-10-CM

## 2012-07-14 MED ORDER — TRAZODONE HCL 50 MG PO TABS: 50.0000 mg | ORAL_TABLET | Freq: Every evening | ORAL | Status: DC | PRN

## 2012-07-14 MED ORDER — TETRAHYDROZOLINE HCL 0.05 % OP SOLN: 2.0000 [drp] | Freq: Three times a day (TID) | OPHTHALMIC | Status: DC
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
## 2012-07-14 NOTE — Patient Instructions (Signed)
Call me with any problems  I will speak with your speech therapist about testing of your cognition. We will probably set this up in a few weeks.  No driving. No work

## 2012-07-18 ENCOUNTER — Ambulatory Visit (HOSPITAL_COMMUNITY)
Admission: RE | Admit: 2012-07-18 | Discharge: 2012-07-18 | Disposition: A | Discharge status: Home / Self Care | Payer: BC Managed Care – PPO | Source: Ambulatory Visit | Attending: Neurosurgery | Admitting: Neurosurgery

## 2012-07-18 DIAGNOSIS — Z09 Encounter for follow-up examination after completed treatment for conditions other than malignant neoplasm: Secondary | ICD-10-CM | POA: Insufficient documentation

## 2012-07-18 DIAGNOSIS — X58XXXA Exposure to other specified factors, initial encounter: Secondary | ICD-10-CM | POA: Insufficient documentation

## 2012-07-18 DIAGNOSIS — S02109A Fracture of base of skull, unspecified side, initial encounter for closed fracture: Secondary | ICD-10-CM

## 2012-07-18 DIAGNOSIS — S06300A Unspecified focal traumatic brain injury without loss of consciousness, initial encounter: Secondary | ICD-10-CM | POA: Insufficient documentation

## 2012-07-19 ENCOUNTER — Inpatient Hospital Stay: Payer: No Typology Code available for payment source | Admitting: Physical Medicine & Rehabilitation

## 2012-07-21 ENCOUNTER — Encounter: Payer: BC Managed Care – PPO | Admitting: Occupational Therapy

## 2012-07-26 ENCOUNTER — Ambulatory Visit: Payer: BC Managed Care – PPO | Admitting: Occupational Therapy

## 2012-07-26 ENCOUNTER — Ambulatory Visit: Payer: BC Managed Care – PPO

## 2012-07-27 ENCOUNTER — Encounter: Payer: BC Managed Care – PPO | Admitting: Occupational Therapy

## 2012-07-27 ENCOUNTER — Ambulatory Visit: Payer: BC Managed Care – PPO

## 2012-07-27 ENCOUNTER — Ambulatory Visit: Payer: BC Managed Care – PPO | Attending: Physical Medicine & Rehabilitation | Admitting: Occupational Therapy

## 2012-07-27 DIAGNOSIS — I69919 Unspecified symptoms and signs involving cognitive functions following unspecified cerebrovascular disease: Secondary | ICD-10-CM | POA: Insufficient documentation

## 2012-07-27 DIAGNOSIS — M2550 Pain in unspecified joint: Secondary | ICD-10-CM | POA: Insufficient documentation

## 2012-07-27 DIAGNOSIS — Z5189 Encounter for other specified aftercare: Secondary | ICD-10-CM | POA: Insufficient documentation

## 2012-07-27 DIAGNOSIS — M6281 Muscle weakness (generalized): Secondary | ICD-10-CM | POA: Insufficient documentation

## 2012-07-27 DIAGNOSIS — I69998 Other sequelae following unspecified cerebrovascular disease: Secondary | ICD-10-CM | POA: Insufficient documentation

## 2012-07-31 ENCOUNTER — Ambulatory Visit: Payer: BC Managed Care – PPO

## 2012-08-14 ENCOUNTER — Ambulatory Visit: Payer: BC Managed Care – PPO | Admitting: Speech Pathology

## 2012-08-14 ENCOUNTER — Ambulatory Visit: Payer: BC Managed Care – PPO | Admitting: *Deleted

## 2012-08-21 ENCOUNTER — Encounter: Payer: BC Managed Care – PPO | Admitting: Occupational Therapy

## 2012-08-21 ENCOUNTER — Encounter: Payer: BC Managed Care – PPO | Admitting: Speech Pathology

## 2012-08-23 ENCOUNTER — Ambulatory Visit: Payer: BC Managed Care – PPO

## 2012-08-23 ENCOUNTER — Ambulatory Visit: Payer: BC Managed Care – PPO | Admitting: Occupational Therapy

## 2012-08-25 ENCOUNTER — Ambulatory Visit (HOSPITAL_COMMUNITY)
Admission: RE | Admit: 2012-08-25 | Discharge: 2012-08-25 | Disposition: A | Discharge status: Home / Self Care | Payer: BC Managed Care – PPO | Source: Ambulatory Visit | Attending: Physical Medicine & Rehabilitation | Admitting: Physical Medicine & Rehabilitation

## 2012-08-25 ENCOUNTER — Encounter
Payer: BC Managed Care – PPO | Attending: Physical Medicine & Rehabilitation | Admitting: Physical Medicine & Rehabilitation

## 2012-08-25 ENCOUNTER — Encounter: Payer: Self-pay | Admitting: Physical Medicine & Rehabilitation

## 2012-08-25 VITALS — BP 141/84 | HR 90 | Resp 14 | Ht 68.0 in | Wt 189.0 lb

## 2012-08-25 DIAGNOSIS — X58XXXA Exposure to other specified factors, initial encounter: Secondary | ICD-10-CM | POA: Insufficient documentation

## 2012-08-25 DIAGNOSIS — G510 Bell's palsy: Secondary | ICD-10-CM

## 2012-08-25 DIAGNOSIS — S4980XA Other specified injuries of shoulder and upper arm, unspecified arm, initial encounter: Secondary | ICD-10-CM | POA: Insufficient documentation

## 2012-08-25 DIAGNOSIS — Z5181 Encounter for therapeutic drug level monitoring: Secondary | ICD-10-CM | POA: Insufficient documentation

## 2012-08-25 DIAGNOSIS — S02109A Fracture of base of skull, unspecified side, initial encounter for closed fracture: Secondary | ICD-10-CM | POA: Insufficient documentation

## 2012-08-25 DIAGNOSIS — S069X9A Unspecified intracranial injury with loss of consciousness of unspecified duration, initial encounter: Secondary | ICD-10-CM

## 2012-08-25 DIAGNOSIS — S46001A Unspecified injury of muscle(s) and tendon(s) of the rotator cuff of right shoulder, initial encounter: Secondary | ICD-10-CM | POA: Insufficient documentation

## 2012-08-25 DIAGNOSIS — G519 Disorder of facial nerve, unspecified: Secondary | ICD-10-CM

## 2012-08-25 DIAGNOSIS — M25519 Pain in unspecified shoulder: Secondary | ICD-10-CM | POA: Insufficient documentation

## 2012-08-25 DIAGNOSIS — S12600A Unspecified displaced fracture of seventh cervical vertebra, initial encounter for closed fracture: Secondary | ICD-10-CM | POA: Insufficient documentation

## 2012-08-25 DIAGNOSIS — S46909A Unspecified injury of unspecified muscle, fascia and tendon at shoulder and upper arm level, unspecified arm, initial encounter: Secondary | ICD-10-CM | POA: Insufficient documentation

## 2012-08-25 MED ORDER — HYDROCODONE-ACETAMINOPHEN 5-325 MG PO TABS: 1.0000 | ORAL_TABLET | Freq: Three times a day (TID) | ORAL | Status: DC | PRN

## 2012-08-25 MED ORDER — MELOXICAM 15 MG PO TABS: 15.0000 mg | ORAL_TABLET | Freq: Every day | ORAL | Status: AC

## 2012-08-25 NOTE — Patient Instructions (Signed)
Call me with questions. Hold off on resistance exercises until we better define your shoulder problems.

## 2012-08-25 NOTE — Progress Notes (Signed)
Subjective:    Patient ID: Kerry Castillo, male    DOB: November 17, 1961, 51 y.o.   MRN: [id number]  HPI  Dempsy is back regarding his TBI and polytrauma. He continues to improve from a cognitive standpoint. He still works with SLP and OT as an outpt. OT had contacted me this week regarding increasing his resistance activities.  The patient is however having persistent shoulder pain which bothers him more with overhead activities. His neck seems to be genrally improving and recent xrays were stable. His elbow seems to be gradually improving as well. He does complain of mild left forearm tenderness which gradually is decreasing.   From a behavioral standpoint things are going well. He seems to be getting along well with his friends, family, and social contacts. His hearing is improving.   Pain Inventory Average Pain 5 Pain Right Now 3 My pain is sharp, burning, stabbing and aching  In the last 24 hours, has pain interfered with the following? General activity 5 Relation with others 1 Enjoyment of life 5 What TIME of day is your pain at its worst? daytime Sleep (in general) Poor  Pain is worse with: bending Pain improves with: heat/ice and medication Relief from Meds: 6  Mobility walk without assistance how many minutes can you walk? 120 ability to climb steps?  yes do you drive?  no  Function disabled: date disabled   Neuro/Psych No problems in this area  Prior Studies Any changes since last visit?  yes CT/MRI  Physicians involved in your care Any changes since last visit?  no   Family History  Problem Relation Age of Onset  . Cancer Mother   . Heart disease Father    History   Social History  . Marital Status: Single    Spouse Name: N/A    Number of Children: N/A  . Years of Education: N/A   Social History Main Topics  . Smoking status: Never Smoker   . Smokeless tobacco: Never Used  . Alcohol Use: No  . Drug Use: No  . Sexually Active: None   Other  Topics Concern  . None   Social History Narrative  . None   History reviewed. No pertinent past surgical history. Past Medical History  Diagnosis Date  . Hypertension   . Kidney stones    BP 141/84  Pulse 90  Resp 14  Ht 5\' 8"  (1.727 m)  Wt 189 lb (85.73 kg)  BMI 28.74 kg/m2  SpO2 98%    Review of Systems  HENT: Positive for neck pain.   All other systems reviewed and are negative.       Objective:   Physical Exam General: Alert and oriented x 3, No apparent distress  HEENT: Head is normocephalic, atraumatic, PERRLA, EOMI, sclera anicteric, oral mucosa pink and moist, dentition intact, ext ear canals clear,  Neck: Supple without JVD or lymphadenopathy  Heart: Reg rate and rhythm. No murmurs rubs or gallops  Chest: CTA bilaterally without wheezes, rales, or rhonchi; no distress  Abdomen: Soft, non-tender, non-distended, bowel sounds positive.  Extremities: No clubbing, cyanosis, or edema. Pulses are 2+  Skin: Clean and intact without signs of breakdown  Neuro: Pt is cognitively appropriate with normal insight, memory, and awareness. Pt displays no gross CN signs. Motor and sensory exam are WNL except for areas where he is having some pain. Musculoskeletal: Full ROM, No pain with AROM or PROM in the trunk, . He has pain with IR and RTC maneuvers on  the right. Apprehension test is negative. He has pain over the right AC joint and subdeltoid bursa. There is generalized tenderenss over the rhomboids and traps also.  He mas mild right axial pain at the base of the neck. Posture appropriate  Psych: Pt's affect is appropriate. Pt is cooperative   Assessment & Plan:   1. 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  2. Nondisplaced fractures of right transverse process fractures at the  C7-T1 level.  5. Left mastoid hemorrhage.  6. 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 Right rotator cuff strain.  Plan:  1. Continue SLP.He seems to be doing extremely well. Would benefit from neuropsych testing in preparation to return to work.  2. After informed consent and preparation of the skin with isopropyl etoh, i injected the right shoulder with 40mg  methyprednisolone and 3cc1%lidocaine. He tolerated well and was experiencing relief before he left the office today. 3. Ordered xrays of the right shoulder to rule out fx or any gross injury. If these look ok and he remains out of pain, i will ask OT to advance his activies and add resistance.  4. Ordered meloxicam 15mg  for his multiple areas of pain, take daily..  5. Follow up with NS(Dr. Newell Coral) as directed.  6. I gave him permission to drive today.  7. All questions were encouraged and answered. All in all, this patient is doing quite well. I'll see him back next month. I won't clear him to work until we have settled a few of the issues above.

## 2012-08-29 ENCOUNTER — Encounter: Payer: BC Managed Care – PPO | Admitting: Occupational Therapy

## 2012-08-29 ENCOUNTER — Telehealth: Payer: Self-pay | Admitting: Physical Medicine & Rehabilitation

## 2012-08-29 NOTE — Telephone Encounter (Signed)
Xray is ok. If his pain is improved after the shoulder injection he may proceed with resistance therapy to tolerance. i will email therapy also

## 2012-08-29 NOTE — Telephone Encounter (Signed)
Message given to Kerry Castillo.

## 2012-08-30 ENCOUNTER — Telehealth: Payer: Self-pay | Admitting: *Deleted

## 2012-08-30 NOTE — Telephone Encounter (Signed)
Notified Kerry Castillo that paper is ready for pick up.

## 2012-08-30 NOTE — Telephone Encounter (Signed)
Disability paperwork. Is this ready for him to pick up?

## 2012-08-31 ENCOUNTER — Ambulatory Visit: Payer: BC Managed Care – PPO | Attending: Physical Medicine & Rehabilitation | Admitting: Occupational Therapy

## 2012-08-31 ENCOUNTER — Ambulatory Visit: Payer: BC Managed Care – PPO

## 2012-08-31 DIAGNOSIS — M2550 Pain in unspecified joint: Secondary | ICD-10-CM | POA: Insufficient documentation

## 2012-08-31 DIAGNOSIS — Z5189 Encounter for other specified aftercare: Secondary | ICD-10-CM | POA: Insufficient documentation

## 2012-08-31 DIAGNOSIS — M6281 Muscle weakness (generalized): Secondary | ICD-10-CM | POA: Insufficient documentation

## 2012-08-31 DIAGNOSIS — I69919 Unspecified symptoms and signs involving cognitive functions following unspecified cerebrovascular disease: Secondary | ICD-10-CM | POA: Insufficient documentation

## 2012-08-31 DIAGNOSIS — I69998 Other sequelae following unspecified cerebrovascular disease: Secondary | ICD-10-CM | POA: Insufficient documentation

## 2012-09-18 ENCOUNTER — Ambulatory Visit: Payer: BC Managed Care – PPO | Admitting: Occupational Therapy

## 2012-09-20 ENCOUNTER — Encounter: Payer: BC Managed Care – PPO | Admitting: Occupational Therapy

## 2012-09-25 ENCOUNTER — Encounter: Payer: Self-pay | Admitting: Physical Medicine & Rehabilitation

## 2012-09-25 ENCOUNTER — Encounter
Payer: BC Managed Care – PPO | Attending: Physical Medicine & Rehabilitation | Admitting: Physical Medicine & Rehabilitation

## 2012-09-25 VITALS — BP 150/83 | HR 94 | Resp 16 | Ht 70.0 in | Wt 193.0 lb

## 2012-09-25 DIAGNOSIS — G510 Bell's palsy: Secondary | ICD-10-CM

## 2012-09-25 DIAGNOSIS — S4980XA Other specified injuries of shoulder and upper arm, unspecified arm, initial encounter: Secondary | ICD-10-CM | POA: Insufficient documentation

## 2012-09-25 DIAGNOSIS — S46001A Unspecified injury of muscle(s) and tendon(s) of the rotator cuff of right shoulder, initial encounter: Secondary | ICD-10-CM

## 2012-09-25 DIAGNOSIS — S069X9A Unspecified intracranial injury with loss of consciousness of unspecified duration, initial encounter: Secondary | ICD-10-CM

## 2012-09-25 DIAGNOSIS — X58XXXA Exposure to other specified factors, initial encounter: Secondary | ICD-10-CM | POA: Insufficient documentation

## 2012-09-25 DIAGNOSIS — Z5181 Encounter for therapeutic drug level monitoring: Secondary | ICD-10-CM | POA: Insufficient documentation

## 2012-09-25 DIAGNOSIS — S46909A Unspecified injury of unspecified muscle, fascia and tendon at shoulder and upper arm level, unspecified arm, initial encounter: Secondary | ICD-10-CM | POA: Insufficient documentation

## 2012-09-25 DIAGNOSIS — S12600A Unspecified displaced fracture of seventh cervical vertebra, initial encounter for closed fracture: Secondary | ICD-10-CM | POA: Insufficient documentation

## 2012-09-25 DIAGNOSIS — G519 Disorder of facial nerve, unspecified: Secondary | ICD-10-CM

## 2012-09-25 DIAGNOSIS — S02109A Fracture of base of skull, unspecified side, initial encounter for closed fracture: Secondary | ICD-10-CM | POA: Insufficient documentation

## 2012-09-25 NOTE — Progress Notes (Signed)
Subjective:    Patient ID: Kerry Castillo, male    DOB: 1961-01-18, 51 y.o.   MRN: [id number]  HPI  Kerry Castillo is back regarding his TBI and trauma. His shoulder is doing MUCH better and he feels that it's back to baseline. 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 He has been discharged from SLP and OT at this time and he wants to go back to work.   Pain Inventory Average Pain 0 Pain Right Now 0 My pain is intermittent  In the last 24 hours, has pain interfered with the following? General activity 0 Relation with others 0 Enjoyment of life 0 What TIME of day is your pain at its worst? N/A Sleep (in general) Fair  Pain is worse with: N/A Pain improves with: N/A Relief from Meds: N/A  Mobility walk without assistance ability to climb steps?  yes do you drive?  yes  Function employed # of hrs/week 40  Neuro/Psych No problems in this area  Prior Studies Any changes since last visit?  no  Physicians involved in your care Any changes since last visit?  no   Family History  Problem Relation Age of Onset  . Cancer Mother   . Heart disease Father    History   Social History  . Marital Status: Single    Spouse Name: N/A    Number of Children: N/A  . Years of Education: N/A   Social History Main Topics  . Smoking status: Never Smoker   . Smokeless tobacco: Never Used  . Alcohol Use: No  . Drug Use: No  . Sexually Active: None   Other Topics Concern  . None   Social History Narrative  . None   History reviewed. No pertinent past surgical history. Past Medical History  Diagnosis Date  . Hypertension   . Kidney stones    BP 150/83  Pulse 94  Resp 16  Ht 5\' 10"  (1.778 m)  Wt 193 lb (87.544 kg)  BMI 27.69 kg/m2  SpO2 98%      Review of Systems  Constitutional: Negative.   HENT: Negative.   Eyes: Negative.   Respiratory: Negative.   Cardiovascular: Negative.   Gastrointestinal: Negative.   Genitourinary:  Negative.   Musculoskeletal: Negative.   Skin: Negative.   Neurological: Negative.   Hematological: Negative.   Psychiatric/Behavioral: Negative.        Objective:   Physical Exam General: Alert and oriented x 3, No apparent distress  HEENT: Head is normocephalic, atraumatic, PERRLA, EOMI, sclera anicteric, oral mucosa pink and moist, dentition intact, ext ear canals clear,  Neck: Supple without JVD or lymphadenopathy  Heart: Reg rate and rhythm. No murmurs rubs or gallops  Chest: CTA bilaterally without wheezes, rales, or rhonchi; no distress  Abdomen: Soft, non-tender, non-distended, bowel sounds positive.  Extremities: No clubbing, cyanosis, or edema. Pulses are 2+  Skin: Clean and intact without signs of breakdown  Neuro: Pt is cognitively appropriate with normal insight, memory, and awareness. Pt displays no gross CN signs. Motor and sensory exam are WNL. Romberg negative. i had hiim stand on one foot and he did not lose balance. . Musculoskeletal: Full ROM, No pain with AROM or PROM in the trunk, . Minimal pain in the shoulder today with any type of provocative testing. Posture is good. Psych: Pt's affect is appropriate. Pt is cooperative  Assessment & Plan:   1. Traumatic brain injury with temporal bone fracture and hemorrhagic  contusion  with left frontal bilateral subtemporal subdural  hematoma.  2. Nondisplaced fractures of right transverse process fractures at the  C7-T1 level.  5. Left mastoid hemorrhage.  6. 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 Right rotator cuff strain.   Plan:  1. Gave him permission to return to work as tolerated. He has to use common sense and look for any signs or symptoms of fatigue, be reasonable about intensity, weight, etc. His job may require heavier lifting at times, but he can ask for help as needed and I believe he has the sense to avoid dangerous situations.  2. Can use meloxicam prn qday for breakthrough  pain. 3. He may see me back PRN. All questions were encouraged and answered.

## 2012-09-25 NOTE — Patient Instructions (Signed)
YOU MAY RETURN TO WORK AS TOLERATED AS OF 09/26/12.  PLEASE BE SENSIBLE ABOUT YOUR PHYSICAL ACTIVITIES AND KNOW YOUR LIMITS.  CALL ME WITH QUESTIONS.

## 2012-12-27 DIAGNOSIS — S062XAA Diffuse traumatic brain injury with loss of consciousness status unknown, initial encounter: Secondary | ICD-10-CM

## 2012-12-27 HISTORY — DX: Diffuse traumatic brain injury with loss of consciousness status unknown, initial encounter: S06.2XAA

## 2020-02-01 DIAGNOSIS — Z87891 Personal history of nicotine dependence: Secondary | ICD-10-CM | POA: Diagnosis not present

## 2020-02-01 DIAGNOSIS — I10 Essential (primary) hypertension: Secondary | ICD-10-CM | POA: Diagnosis not present

## 2020-02-01 DIAGNOSIS — Z6827 Body mass index (BMI) 27.0-27.9, adult: Secondary | ICD-10-CM | POA: Diagnosis not present

## 2020-02-01 DIAGNOSIS — Z299 Encounter for prophylactic measures, unspecified: Secondary | ICD-10-CM | POA: Diagnosis not present

## 2020-02-01 DIAGNOSIS — F329 Major depressive disorder, single episode, unspecified: Secondary | ICD-10-CM | POA: Diagnosis not present

## 2020-02-01 DIAGNOSIS — F419 Anxiety disorder, unspecified: Secondary | ICD-10-CM | POA: Diagnosis not present

## 2020-02-04 DIAGNOSIS — R300 Dysuria: Secondary | ICD-10-CM | POA: Diagnosis not present

## 2020-02-04 DIAGNOSIS — Z6824 Body mass index (BMI) 24.0-24.9, adult: Secondary | ICD-10-CM | POA: Diagnosis not present

## 2020-02-08 DIAGNOSIS — R369 Urethral discharge, unspecified: Secondary | ICD-10-CM | POA: Diagnosis not present

## 2020-02-08 DIAGNOSIS — Z6824 Body mass index (BMI) 24.0-24.9, adult: Secondary | ICD-10-CM | POA: Diagnosis not present

## 2020-02-08 DIAGNOSIS — A64 Unspecified sexually transmitted disease: Secondary | ICD-10-CM | POA: Diagnosis not present

## 2020-02-29 DIAGNOSIS — F419 Anxiety disorder, unspecified: Secondary | ICD-10-CM | POA: Diagnosis not present

## 2020-02-29 DIAGNOSIS — Z299 Encounter for prophylactic measures, unspecified: Secondary | ICD-10-CM | POA: Diagnosis not present

## 2020-02-29 DIAGNOSIS — I10 Essential (primary) hypertension: Secondary | ICD-10-CM | POA: Diagnosis not present

## 2020-02-29 DIAGNOSIS — Z6829 Body mass index (BMI) 29.0-29.9, adult: Secondary | ICD-10-CM | POA: Diagnosis not present

## 2020-02-29 DIAGNOSIS — Z713 Dietary counseling and surveillance: Secondary | ICD-10-CM | POA: Diagnosis not present

## 2020-03-20 DIAGNOSIS — Z23 Encounter for immunization: Secondary | ICD-10-CM | POA: Diagnosis not present

## 2020-04-12 DIAGNOSIS — Z23 Encounter for immunization: Secondary | ICD-10-CM | POA: Diagnosis not present

## 2020-09-09 DIAGNOSIS — I10 Essential (primary) hypertension: Secondary | ICD-10-CM | POA: Diagnosis not present

## 2020-09-09 DIAGNOSIS — N200 Calculus of kidney: Secondary | ICD-10-CM | POA: Diagnosis not present

## 2020-09-09 DIAGNOSIS — Z299 Encounter for prophylactic measures, unspecified: Secondary | ICD-10-CM | POA: Diagnosis not present

## 2020-10-06 DIAGNOSIS — Z125 Encounter for screening for malignant neoplasm of prostate: Secondary | ICD-10-CM | POA: Diagnosis not present

## 2020-10-06 DIAGNOSIS — I10 Essential (primary) hypertension: Secondary | ICD-10-CM | POA: Diagnosis not present

## 2020-10-06 DIAGNOSIS — R5383 Other fatigue: Secondary | ICD-10-CM | POA: Diagnosis not present

## 2020-10-06 DIAGNOSIS — Z79899 Other long term (current) drug therapy: Secondary | ICD-10-CM | POA: Diagnosis not present

## 2020-10-06 DIAGNOSIS — Z6827 Body mass index (BMI) 27.0-27.9, adult: Secondary | ICD-10-CM | POA: Diagnosis not present

## 2020-10-06 DIAGNOSIS — F17210 Nicotine dependence, cigarettes, uncomplicated: Secondary | ICD-10-CM | POA: Diagnosis not present

## 2020-10-06 DIAGNOSIS — Z299 Encounter for prophylactic measures, unspecified: Secondary | ICD-10-CM | POA: Diagnosis not present

## 2020-10-06 DIAGNOSIS — Z1331 Encounter for screening for depression: Secondary | ICD-10-CM | POA: Diagnosis not present

## 2020-10-06 DIAGNOSIS — Z0000 Encounter for general adult medical examination without abnormal findings: Secondary | ICD-10-CM | POA: Diagnosis not present

## 2021-01-06 DIAGNOSIS — F32A Depression, unspecified: Secondary | ICD-10-CM | POA: Diagnosis not present

## 2021-01-06 DIAGNOSIS — Z6827 Body mass index (BMI) 27.0-27.9, adult: Secondary | ICD-10-CM | POA: Diagnosis not present

## 2021-01-06 DIAGNOSIS — N529 Male erectile dysfunction, unspecified: Secondary | ICD-10-CM | POA: Diagnosis not present

## 2021-01-06 DIAGNOSIS — I10 Essential (primary) hypertension: Secondary | ICD-10-CM | POA: Diagnosis not present

## 2021-01-06 DIAGNOSIS — Z87891 Personal history of nicotine dependence: Secondary | ICD-10-CM | POA: Diagnosis not present

## 2021-01-06 DIAGNOSIS — Z299 Encounter for prophylactic measures, unspecified: Secondary | ICD-10-CM | POA: Diagnosis not present

## 2021-01-06 DIAGNOSIS — F419 Anxiety disorder, unspecified: Secondary | ICD-10-CM | POA: Diagnosis not present

## 2021-01-19 DIAGNOSIS — A630 Anogenital (venereal) warts: Secondary | ICD-10-CM | POA: Diagnosis not present

## 2021-01-19 DIAGNOSIS — D225 Melanocytic nevi of trunk: Secondary | ICD-10-CM | POA: Diagnosis not present

## 2021-01-19 DIAGNOSIS — L570 Actinic keratosis: Secondary | ICD-10-CM | POA: Diagnosis not present

## 2021-01-19 DIAGNOSIS — D485 Neoplasm of uncertain behavior of skin: Secondary | ICD-10-CM | POA: Diagnosis not present

## 2021-02-12 DIAGNOSIS — D485 Neoplasm of uncertain behavior of skin: Secondary | ICD-10-CM | POA: Diagnosis not present

## 2021-02-12 DIAGNOSIS — L988 Other specified disorders of the skin and subcutaneous tissue: Secondary | ICD-10-CM | POA: Diagnosis not present

## 2021-04-29 DIAGNOSIS — H00025 Hordeolum internum left lower eyelid: Secondary | ICD-10-CM | POA: Diagnosis not present

## 2021-07-10 DIAGNOSIS — H00025 Hordeolum internum left lower eyelid: Secondary | ICD-10-CM | POA: Diagnosis not present

## 2021-08-08 DIAGNOSIS — H10013 Acute follicular conjunctivitis, bilateral: Secondary | ICD-10-CM | POA: Diagnosis not present

## 2021-10-21 DIAGNOSIS — R509 Fever, unspecified: Secondary | ICD-10-CM | POA: Diagnosis not present

## 2021-10-21 DIAGNOSIS — R059 Cough, unspecified: Secondary | ICD-10-CM | POA: Diagnosis not present

## 2021-10-21 DIAGNOSIS — J029 Acute pharyngitis, unspecified: Secondary | ICD-10-CM | POA: Diagnosis not present

## 2021-10-21 DIAGNOSIS — R519 Headache, unspecified: Secondary | ICD-10-CM | POA: Diagnosis not present

## 2021-12-28 DIAGNOSIS — H10013 Acute follicular conjunctivitis, bilateral: Secondary | ICD-10-CM | POA: Diagnosis not present

## 2022-01-06 DIAGNOSIS — I10 Essential (primary) hypertension: Secondary | ICD-10-CM | POA: Diagnosis not present

## 2022-01-06 DIAGNOSIS — F419 Anxiety disorder, unspecified: Secondary | ICD-10-CM | POA: Diagnosis not present

## 2022-01-06 DIAGNOSIS — F17210 Nicotine dependence, cigarettes, uncomplicated: Secondary | ICD-10-CM | POA: Diagnosis not present

## 2022-01-06 DIAGNOSIS — Z0000 Encounter for general adult medical examination without abnormal findings: Secondary | ICD-10-CM | POA: Diagnosis not present

## 2022-01-06 DIAGNOSIS — Z1331 Encounter for screening for depression: Secondary | ICD-10-CM | POA: Diagnosis not present

## 2022-01-06 DIAGNOSIS — Z6829 Body mass index (BMI) 29.0-29.9, adult: Secondary | ICD-10-CM | POA: Diagnosis not present

## 2022-01-06 DIAGNOSIS — E7800 Pure hypercholesterolemia, unspecified: Secondary | ICD-10-CM | POA: Diagnosis not present

## 2022-01-06 DIAGNOSIS — Z299 Encounter for prophylactic measures, unspecified: Secondary | ICD-10-CM | POA: Diagnosis not present

## 2022-01-06 DIAGNOSIS — N200 Calculus of kidney: Secondary | ICD-10-CM | POA: Diagnosis not present

## 2022-01-09 DIAGNOSIS — H10013 Acute follicular conjunctivitis, bilateral: Secondary | ICD-10-CM | POA: Diagnosis not present

## 2022-01-13 ENCOUNTER — Ambulatory Visit (HOSPITAL_COMMUNITY)
Admission: RE | Admit: 2022-01-13 | Discharge: 2022-01-13 | Disposition: A | Discharge status: Home / Self Care | Payer: BC Managed Care – PPO | Source: Ambulatory Visit | Attending: Physician Assistant | Admitting: Physician Assistant

## 2022-01-13 ENCOUNTER — Other Ambulatory Visit: Payer: Self-pay

## 2022-01-13 ENCOUNTER — Ambulatory Visit (INDEPENDENT_AMBULATORY_CARE_PROVIDER_SITE_OTHER): Payer: BC Managed Care – PPO | Admitting: Physician Assistant

## 2022-01-13 VITALS — BP 109/69 | HR 87

## 2022-01-13 DIAGNOSIS — R110 Nausea: Secondary | ICD-10-CM

## 2022-01-13 DIAGNOSIS — R310 Gross hematuria: Secondary | ICD-10-CM

## 2022-01-13 DIAGNOSIS — F419 Anxiety disorder, unspecified: Secondary | ICD-10-CM | POA: Diagnosis not present

## 2022-01-13 DIAGNOSIS — N200 Calculus of kidney: Secondary | ICD-10-CM | POA: Insufficient documentation

## 2022-01-13 DIAGNOSIS — Z0000 Encounter for general adult medical examination without abnormal findings: Secondary | ICD-10-CM | POA: Diagnosis not present

## 2022-01-13 DIAGNOSIS — E7800 Pure hypercholesterolemia, unspecified: Secondary | ICD-10-CM | POA: Diagnosis not present

## 2022-01-13 DIAGNOSIS — Z125 Encounter for screening for malignant neoplasm of prostate: Secondary | ICD-10-CM | POA: Diagnosis not present

## 2022-01-13 DIAGNOSIS — R109 Unspecified abdominal pain: Secondary | ICD-10-CM | POA: Insufficient documentation

## 2022-01-13 DIAGNOSIS — Z79899 Other long term (current) drug therapy: Secondary | ICD-10-CM | POA: Diagnosis not present

## 2022-01-13 LAB — URINALYSIS, ROUTINE W REFLEX MICROSCOPIC
Bilirubin, UA: NEGATIVE
Glucose, UA: NEGATIVE
Ketones, UA: NEGATIVE
Leukocytes,UA: NEGATIVE
Nitrite, UA: NEGATIVE
Protein,UA: NEGATIVE
Specific Gravity, UA: 1.025 (ref 1.005–1.030)
Urobilinogen, Ur: 0.2 mg/dL (ref 0.2–1.0)
pH, UA: 6 (ref 5.0–7.5)

## 2022-01-13 LAB — MICROSCOPIC EXAMINATION
Bacteria, UA: NONE SEEN
Renal Epithel, UA: NONE SEEN /hpf
WBC, UA: NONE SEEN /hpf (ref 0–5)

## 2022-01-13 MED ORDER — KETOROLAC TROMETHAMINE 10 MG PO TABS: 10.0000 mg | ORAL_TABLET | Freq: Four times a day (QID) | ORAL | 0 refills | Status: AC | PRN

## 2022-01-13 MED ORDER — ONDANSETRON HCL 4 MG PO TABS: 4.0000 mg | ORAL_TABLET | Freq: Three times a day (TID) | ORAL | 0 refills | Status: DC | PRN

## 2022-01-13 NOTE — Progress Notes (Signed)
01/13/2022 2:30 PM   [name] 1961-08-25 [id number]   Assessment:  1. Flank pain   2. Nephrolithiasis   3. Gross hematuria     Plan: Zofran and Toradol Rx. He is advised to increase fluid intake and continue tamsulosin as recently prescribed by his PCP. The pt requested pain medications which were denied without imaging to confirm stone source of pain. Recommended CT stone study and pt unwilling at this time. Discussed Renal US vs KUB and limitations of both. The pt agrees to KUB. He understands additional imaging may be indicated. Pt advised that gross hematuria and microscopic hematuria may be caused by other conditions than urinary stones, including renal and bladder cancers. FU in one week for recheck UA and to discuss imaging. If KUB does not show any stones, pt needs CT stone study as recommended today. Pt also advised to bring any stone fragments he may pass for analysis.    Chief Complaint: Flank pain  Referring provider: Monico Blitz, MD [address] [place],  [place] [address]   History of Present Illness:  Kerry Castillo is a 61 y.o. year old male with h/o nephrolithiasis who is seen in consultation from Monico Blitz, MD for evaluation of left sided flank pain and intermittent gross hematuria for approximately 1 month. Pt stones last tx in 2013. He also c/o urinary urgency, frequency, and nausea, but no vomiting. Pt denies fever, chills. No urethral discharge. Pt states he has passed several stones over the past 2 years. Pt reports ESL in [place] approximately 25 years ago with stone analysis. He does not remember results, but thinks he was possibly on Urocit K in the past for prevention. Medical records indicate pt underwent ESL for right sided stone in 2013 at [facility]. Stone risks include decrease H2O intake and soda intake.  Pt is a smoker  UA=11-30 RBCs, otherwise clear IPPS=5   Past Medical History:  Past Medical History:  Diagnosis Date    Hypertension    Kidney stones     Past Surgical History:  No past surgical history on file.  Allergies:  No Known Allergies  Family History:  Family History  Problem Relation Age of Onset   Cancer Mother    Heart disease Father     Social History:  Social History   Tobacco Use   Smoking status: Never   Smokeless tobacco: Never  Substance Use Topics   Alcohol use: No   Drug use: No    Review of symptoms:  Constitutional:  Negative for unexplained weight loss, night sweats, fever, chills ENT:  Negative for nose bleeds, sinus pain, painful swallowing CV:  Negative for chest pain, shortness of breath, exercise intolerance, palpitations, loss of consciousness Resp:  Negative for cough, wheezing, shortness of breath GI:  Negative for vomiting, diarrhea, bloody stools GU:  Positives noted in HPI; otherwise negative for dysuria, urinary incontinence Neuro:  Negative for seizures, poor balance, limb weakness, slurred speech Psych:  Negative for lack of energy, depression Endocrine:  Negative for polydipsia, polyuria, symptoms of hypoglycemia (dizziness, hunger, sweating) Hematologic:  Negative for anemia, purpura, petechia, prolonged or excessive bleeding, use of anticoagulants  Allergic:  Negative for difficulty breathing or choking as a result of exposure to anything; no shellfish allergy; no allergic response (rash/itch) to materials, foods   Physical Exam: BP 109/69    Pulse 87   Constitutional:  Alert and oriented, No acute distress. Pt appears comfy HEENT: NCAT, moist mucus membranes.  Trachea  midline Cardiovascular: Regular rate and rhythm without murmur, rub, or gallops No clubbing, cyanosis, or edema. Respiratory: Normal respiratory effort, clear to auscultation bilaterally GI: Abdomen is soft, nontender, nondistended, no abdominal masses BACK:  Non-tender to palpation. Mild left sided CVAT Skin: No obvious rashes, warm, dry, intact Neurologic: Alert and oriented,  Cranial nerves grossly intact, no focal deficits, moving all 4 extremities. Psychiatric: Appropriate. Normal mood and affect.  Laboratory Data: Results for orders placed or performed in visit on 01/13/22 (from the past 24 hour(s))  Microscopic Examination   Collection Time: 01/13/22  3:04 PM   Urine  Result Value Ref Range   WBC, UA None seen 0 - 5 /hpf   RBC 11-30 (A) 0 - 2 /hpf   Epithelial Cells (non renal) 0-10 0 - 10 /hpf   Renal Epithel, UA None seen None seen /hpf   Mucus, UA Present Not Estab.   Bacteria, UA None seen None seen/Few  Urinalysis, Routine w reflex microscopic   Collection Time: 01/13/22  3:04 PM  Result Value Ref Range   Specific Gravity, UA 1.025 1.005 - 1.030   pH, UA 6.0 5.0 - 7.5   Color, UA Yellow Yellow   Appearance Ur Clear Clear   Leukocytes,UA Negative Negative   Protein,UA Negative Negative/Trace   Glucose, UA Negative Negative   Ketones, UA Negative Negative   RBC, UA 2+ (A) Negative   Bilirubin, UA Negative Negative   Urobilinogen, Ur 0.2 0.2 - 1.0 mg/dL   Nitrite, UA Negative Negative   Microscopic Examination See below:     Lab Results  Component Value Date   WBC 10.5 06/20/2012   HGB 13.6 06/20/2012   HCT 37.7 (L) 06/20/2012   MCV 87.1 06/20/2012   PLT 300 06/20/2012    Lab Results  Component Value Date   CREATININE 0.74 06/20/2012    PSA  2013: 1.46  No results found for: TESTOSTERONE  No results found for: HGBA1C  Urinalysis    Component Value Date/Time   COLORURINE YELLOW 06/14/2012 1044   APPEARANCEUR CLEAR 06/14/2012 1044   LABSPEC 1.037 (H) 06/14/2012 1044   PHURINE 7.5 06/14/2012 1044   GLUCOSEU NEGATIVE 06/14/2012 1044   HGBUR MODERATE (A) 06/14/2012 1044   BILIRUBINUR NEGATIVE 06/14/2012 1044   KETONESUR 15 (A) 06/14/2012 1044   PROTEINUR NEGATIVE 06/14/2012 1044   UROBILINOGEN 0.2 06/14/2012 1044   NITRITE NEGATIVE 06/14/2012 1044   LEUKOCYTESUR NEGATIVE 06/14/2012 1044    Lab Results  Component  Value Date   BACTERIA RARE 06/14/2012    Pertinent Imaging: No recent imaging CT stone study images reviewed from 2003 and CTAP reviewed from 2013 which reveals non obstructing left sided renal stones  Results for orders placed during the hospital encounter of 11/14/08  DG Abd 1 View  Narrative Clinical Data: Pre lithotripsy for left kidney stone  ABDOMEN - 1 VIEW  Comparison: No priors  Findings: There are several small left renal calculi, and a probable right renal calculus.  In addition, there is a 5.9 x 4.4 mm calcification in the left pelvis, likely to represent a distal ureteral stone.  Psoas margins intact.  Bowel gas pattern unremarkable.  Osseous structures normal.  IMPRESSION:  1.  Small renal calculi. 2.  Probable 5.9 x 4.4 mm left distal ureteral calculus.  Provider: Angelica Chessman     [place], Berneice Heinrich, PA-C [facility]

## 2022-01-13 NOTE — Progress Notes (Signed)
Urological Symptom Review  Patient is experiencing the following symptoms: Frequent urination Hard to postpone urination Blood in urine Kidney stones   Review of Systems  Gastrointestinal (upper)  : Negative for upper GI symptoms  Gastrointestinal (lower) : Negative for lower GI symptoms  Constitutional : Negative for symptoms  Skin: Negative for skin symptoms  Eyes: Blurred vision Double vision  Ear/Nose/Throat : Negative for Ear/Nose/Throat symptoms  Hematologic/Lymphatic: Negative for Hematologic/Lymphatic symptoms  Cardiovascular : Negative for cardiovascular symptoms  Respiratory : Negative for respiratory symptoms  Endocrine: Negative for endocrine symptoms  Musculoskeletal: Negative for musculoskeletal symptoms  Neurological: Negative for neurological symptoms  Psychologic: Negative for psychiatric symptoms

## 2022-01-20 ENCOUNTER — Encounter (INDEPENDENT_AMBULATORY_CARE_PROVIDER_SITE_OTHER): Payer: BC Managed Care – PPO | Admitting: Ophthalmology

## 2022-01-21 ENCOUNTER — Other Ambulatory Visit: Payer: Self-pay

## 2022-01-21 ENCOUNTER — Ambulatory Visit (INDEPENDENT_AMBULATORY_CARE_PROVIDER_SITE_OTHER): Payer: BC Managed Care – PPO | Admitting: Ophthalmology

## 2022-01-21 ENCOUNTER — Encounter (INDEPENDENT_AMBULATORY_CARE_PROVIDER_SITE_OTHER): Payer: Self-pay | Admitting: Ophthalmology

## 2022-01-21 ENCOUNTER — Ambulatory Visit: Payer: BC Managed Care – PPO | Admitting: Physician Assistant

## 2022-01-21 DIAGNOSIS — H35033 Hypertensive retinopathy, bilateral: Secondary | ICD-10-CM | POA: Diagnosis not present

## 2022-01-21 DIAGNOSIS — I10 Essential (primary) hypertension: Secondary | ICD-10-CM | POA: Diagnosis not present

## 2022-01-21 DIAGNOSIS — H2513 Age-related nuclear cataract, bilateral: Secondary | ICD-10-CM

## 2022-01-21 DIAGNOSIS — H3322 Serous retinal detachment, left eye: Secondary | ICD-10-CM
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
## 2022-01-26 ENCOUNTER — Other Ambulatory Visit: Payer: Self-pay

## 2022-01-26 ENCOUNTER — Encounter (HOSPITAL_COMMUNITY): Payer: Self-pay | Admitting: Ophthalmology

## 2022-01-26 NOTE — Progress Notes (Signed)
Spoke with pt for pre-op call. Pt has hx of HTN, has not been treated for several years due losing weight and no longer hypertensive. Pt is not diabetic and does not have a cardiac history.  Pt's surgery is scheduled as ambulatory so no Covid test is required prior to surgery.

## 2022-01-28 ENCOUNTER — Ambulatory Visit (HOSPITAL_COMMUNITY)
Admission: RE | Admit: 2022-01-28 | Discharge: 2022-01-28 | Disposition: A | Discharge status: Home / Self Care | Payer: BC Managed Care – PPO | Attending: Ophthalmology | Admitting: Ophthalmology

## 2022-01-28 ENCOUNTER — Encounter (HOSPITAL_COMMUNITY): Payer: Self-pay | Admitting: Ophthalmology

## 2022-01-28 ENCOUNTER — Ambulatory Visit (HOSPITAL_COMMUNITY): Payer: BC Managed Care – PPO | Admitting: Certified Registered Nurse Anesthetist

## 2022-01-28 ENCOUNTER — Encounter (HOSPITAL_COMMUNITY)
Admission: RE | Disposition: A | Discharge status: Home / Self Care | Payer: Self-pay | Source: Home / Self Care | Attending: Ophthalmology

## 2022-01-28 DIAGNOSIS — F17290 Nicotine dependence, other tobacco product, uncomplicated: Secondary | ICD-10-CM | POA: Insufficient documentation

## 2022-01-28 DIAGNOSIS — H338 Other retinal detachments: Secondary | ICD-10-CM

## 2022-01-28 DIAGNOSIS — H33002 Unspecified retinal detachment with retinal break, left eye: Secondary | ICD-10-CM | POA: Diagnosis not present

## 2022-01-28 HISTORY — DX: Personal history of urinary calculi: Z87.442

## 2022-01-28 HISTORY — PX: AIR/FLUID EXCHANGE: SHX6494

## 2022-01-28 HISTORY — PX: GAS INSERTION: SHX5336

## 2022-01-28 HISTORY — PX: PHOTOCOAGULATION WITH LASER: SHX6027

## 2022-01-28 HISTORY — PX: VITRECTOMY 25 GAUGE WITH SCLERAL BUCKLE: SHX6183

## 2022-01-28 LAB — BASIC METABOLIC PANEL
Anion gap: 8 (ref 5–15)
BUN: 17 mg/dL (ref 6–20)
CO2: 26 mmol/L (ref 22–32)
Calcium: 9 mg/dL (ref 8.9–10.3)
Chloride: 106 mmol/L (ref 98–111)
Creatinine, Ser: 0.9 mg/dL (ref 0.61–1.24)
GFR, Estimated: >60 mL/min (ref >60)
Glucose, Bld: 88 mg/dL (ref 70–99)
Potassium: 3.6 mmol/L (ref 3.5–5.1)
Sodium: 140 mmol/L (ref 135–145)

## 2022-01-28 SURGERY — VITRECTOMY, USING 25-GAUGE INSTRUMENTS, WITH SCLERAL BUCKLING
Anesthesia: General | Site: Eye | Laterality: Left

## 2022-01-28 MED ORDER — STERILE WATER FOR INJECTION IJ SOLN
INTRAMUSCULAR | Status: DC | PRN
  Administered 2022-01-28: 20 mL

## 2022-01-28 MED ORDER — TOBRAMYCIN-DEXAMETHASONE 0.3-0.1 % OP OINT
TOPICAL_OINTMENT | OPHTHALMIC | Status: AC
  Filled 2022-01-28: qty 3.5

## 2022-01-28 MED ORDER — BSS PLUS IO SOLN
INTRAOCULAR | Status: DC | PRN
  Administered 2022-01-28: 1 via INTRAOCULAR

## 2022-01-28 MED ORDER — PHENYLEPHRINE HCL-NACL 20-0.9 MG/250ML-% IV SOLN
INTRAVENOUS | Status: DC | PRN
  Administered 2022-01-28: 25 ug/min via INTRAVENOUS

## 2022-01-28 MED ORDER — BACITRACIN-POLYMYXIN B 500-10000 UNIT/GM OP OINT
TOPICAL_OINTMENT | OPHTHALMIC | Status: AC
  Filled 2022-01-28: qty 3.5

## 2022-01-28 MED ORDER — STERILE WATER FOR INJECTION IJ SOLN
INTRAMUSCULAR | Status: AC
  Filled 2022-01-28: qty 10

## 2022-01-28 MED ORDER — EPINEPHRINE PF 1 MG/ML IJ SOLN
INTRAMUSCULAR | Status: DC | PRN
  Administered 2022-01-28: .3 mL

## 2022-01-28 MED ORDER — LIDOCAINE HCL (PF) 4 % IJ SOLN
INTRAMUSCULAR | Status: AC
  Filled 2022-01-28: qty 5

## 2022-01-28 MED ORDER — CHLORHEXIDINE GLUCONATE 0.12 % MT SOLN
15.0000 mL | OROMUCOSAL | Status: AC
  Filled 2022-01-28: qty 15

## 2022-01-28 MED ORDER — ACETAMINOPHEN 10 MG/ML IV SOLN: 1000.0000 mg | Freq: Once | INTRAVENOUS | Status: DC | PRN

## 2022-01-28 MED ORDER — AMISULPRIDE (ANTIEMETIC) 5 MG/2ML IV SOLN: 10.0000 mg | Freq: Once | INTRAVENOUS | Status: DC | PRN

## 2022-01-28 MED ORDER — OXYCODONE HCL 5 MG PO TABS: 5.0000 mg | ORAL_TABLET | Freq: Once | ORAL | Status: DC | PRN

## 2022-01-28 MED ORDER — BSS IO SOLN
INTRAOCULAR | Status: DC | PRN
  Administered 2022-01-28: 1

## 2022-01-28 MED ORDER — ACETAMINOPHEN 160 MG/5ML PO SOLN: 325.0000 mg | ORAL | Status: DC | PRN

## 2022-01-28 MED ORDER — FENTANYL CITRATE (PF) 250 MCG/5ML IJ SOLN
INTRAMUSCULAR | Status: AC
  Filled 2022-01-28: qty 5

## 2022-01-28 MED ORDER — PROPARACAINE HCL 0.5 % OP SOLN
OPHTHALMIC | Status: AC
  Administered 2022-01-28: 1 [drp] via OPHTHALMIC
  Filled 2022-01-28: qty 15

## 2022-01-28 MED ORDER — 0.9 % SODIUM CHLORIDE (POUR BTL) OPTIME
TOPICAL | Status: DC | PRN
Start: 2022-01-28 — End: 2022-01-28
  Administered 2022-01-28: 1000 mL

## 2022-01-28 MED ORDER — FENTANYL CITRATE (PF) 250 MCG/5ML IJ SOLN
INTRAMUSCULAR | Status: DC | PRN
  Administered 2022-01-28: 100 ug via INTRAVENOUS

## 2022-01-28 MED ORDER — BSS PLUS IO SOLN
INTRAOCULAR | Status: AC
  Filled 2022-01-28: qty 500

## 2022-01-28 MED ORDER — PHENYLEPHRINE HCL 10 % OP SOLN
1.0000 [drp] | OPHTHALMIC | Status: AC | PRN
  Administered 2022-01-28 (×2): 1 [drp] via OPHTHALMIC

## 2022-01-28 MED ORDER — BSS IO SOLN
INTRAOCULAR | Status: AC
  Filled 2022-01-28: qty 15

## 2022-01-28 MED ORDER — PHENYLEPHRINE HCL 10 % OP SOLN
OPHTHALMIC | Status: AC
  Administered 2022-01-28: 1 [drp] via OPHTHALMIC
  Filled 2022-01-28: qty 5

## 2022-01-28 MED ORDER — ATROPINE SULFATE 1 % OP SOLN
1.0000 [drp] | OPHTHALMIC | Status: AC | PRN
  Administered 2022-01-28 (×2): 1 [drp] via OPHTHALMIC

## 2022-01-28 MED ORDER — BRIMONIDINE TARTRATE 0.2 % OP SOLN
OPHTHALMIC | Status: AC
  Filled 2022-01-28: qty 5

## 2022-01-28 MED ORDER — PROPOFOL 10 MG/ML IV BOLUS
INTRAVENOUS | Status: DC | PRN
  Administered 2022-01-28: 150 mg via INTRAVENOUS

## 2022-01-28 MED ORDER — DORZOLAMIDE HCL-TIMOLOL MAL 2-0.5 % OP SOLN
OPHTHALMIC | Status: AC
  Filled 2022-01-28: qty 10

## 2022-01-28 MED ORDER — POLYMYXIN B SULFATE 500000 UNITS IJ SOLR
INTRAMUSCULAR | Status: AC
  Filled 2022-01-28: qty 500000

## 2022-01-28 MED ORDER — ROCURONIUM BROMIDE 10 MG/ML (PF) SYRINGE
PREFILLED_SYRINGE | INTRAVENOUS | Status: DC | PRN
  Administered 2022-01-28: 50 mg via INTRAVENOUS

## 2022-01-28 MED ORDER — NA CHONDROIT SULF-NA HYALURON 40-30 MG/ML IO SOSY
INTRAOCULAR | Status: AC
  Filled 2022-01-28: qty 1

## 2022-01-28 MED ORDER — LIDOCAINE HCL (PF) 2 % IJ SOLN
INTRAMUSCULAR | Status: AC
  Filled 2022-01-28: qty 10

## 2022-01-28 MED ORDER — LIDOCAINE 2% (20 MG/ML) 5 ML SYRINGE
INTRAMUSCULAR | Status: DC | PRN
  Administered 2022-01-28: 40 mg via INTRAVENOUS

## 2022-01-28 MED ORDER — ONDANSETRON HCL 4 MG/2ML IJ SOLN
INTRAMUSCULAR | Status: DC | PRN
Start: 2022-01-28 — End: 2022-01-28
  Administered 2022-01-28: 4 mg via INTRAVENOUS

## 2022-01-28 MED ORDER — CEFTAZIDIME 1 G IJ SOLR
INTRAMUSCULAR | Status: AC
  Filled 2022-01-28: qty 1

## 2022-01-28 MED ORDER — OXYCODONE HCL 5 MG/5ML PO SOLN: 5.0000 mg | Freq: Once | ORAL | Status: DC | PRN

## 2022-01-28 MED ORDER — DEXAMETHASONE SODIUM PHOSPHATE 10 MG/ML IJ SOLN
INTRAMUSCULAR | Status: DC | PRN
Start: 2022-01-28 — End: 2022-01-28
  Administered 2022-01-28: 5 mg via INTRAVENOUS

## 2022-01-28 MED ORDER — PREDNISOLONE ACETATE 1 % OP SUSP
OPHTHALMIC | Status: AC
  Filled 2022-01-28: qty 5

## 2022-01-28 MED ORDER — SUGAMMADEX SODIUM 200 MG/2ML IV SOLN
INTRAVENOUS | Status: DC | PRN
  Administered 2022-01-28: 160 mg via INTRAVENOUS

## 2022-01-28 MED ORDER — BACITRACIN-POLYMYXIN B 500-10000 UNIT/GM OP OINT
TOPICAL_OINTMENT | OPHTHALMIC | Status: DC | PRN
  Administered 2022-01-28: 1 via OPHTHALMIC

## 2022-01-28 MED ORDER — SODIUM CHLORIDE (PF) 0.9 % IJ SOLN
INTRAMUSCULAR | Status: AC
  Filled 2022-01-28: qty 10

## 2022-01-28 MED ORDER — CHLORHEXIDINE GLUCONATE 0.12 % MT SOLN
OROMUCOSAL | Status: AC
  Administered 2022-01-28: 15 mL via OROMUCOSAL
  Filled 2022-01-28: qty 15

## 2022-01-28 MED ORDER — TRIAMCINOLONE ACETONIDE 40 MG/ML IJ SUSP
INTRAMUSCULAR | Status: AC
  Filled 2022-01-28: qty 5

## 2022-01-28 MED ORDER — DEXAMETHASONE SODIUM PHOSPHATE 10 MG/ML IJ SOLN
INTRAMUSCULAR | Status: AC
  Filled 2022-01-28: qty 1

## 2022-01-28 MED ORDER — MIDAZOLAM HCL 2 MG/2ML IJ SOLN
INTRAMUSCULAR | Status: DC | PRN
  Administered 2022-01-28: 2 mg via INTRAVENOUS

## 2022-01-28 MED ORDER — EPINEPHRINE PF 1 MG/ML IJ SOLN
INTRAMUSCULAR | Status: AC
  Filled 2022-01-28: qty 1

## 2022-01-28 MED ORDER — LIDOCAINE HCL (PF) 1 % IJ SOLN
INTRAMUSCULAR | Status: DC | PRN
  Administered 2022-01-28: 3.5 mL

## 2022-01-28 MED ORDER — HYDROCODONE-ACETAMINOPHEN 5-325 MG PO TABS
1.0000 | ORAL_TABLET | Freq: Once | ORAL | Status: AC
  Administered 2022-01-28: 1 via ORAL

## 2022-01-28 MED ORDER — ACETAZOLAMIDE SODIUM 500 MG IJ SOLR
INTRAMUSCULAR | Status: AC
  Filled 2022-01-28: qty 500

## 2022-01-28 MED ORDER — BUPIVACAINE HCL (PF) 0.75 % IJ SOLN
INTRAMUSCULAR | Status: AC
  Filled 2022-01-28: qty 10

## 2022-01-28 MED ORDER — CARBACHOL 0.01 % IO SOLN
INTRAOCULAR | Status: AC
  Filled 2022-01-28: qty 1.5

## 2022-01-28 MED ORDER — ATROPINE SULFATE 1 % OP SOLN
OPHTHALMIC | Status: AC
  Filled 2022-01-28: qty 5

## 2022-01-28 MED ORDER — TROPICAMIDE 1 % OP SOLN
1.0000 [drp] | OPHTHALMIC | Status: AC | PRN
  Administered 2022-01-28 (×2): 1 [drp] via OPHTHALMIC

## 2022-01-28 MED ORDER — TROPICAMIDE 1 % OP SOLN
OPHTHALMIC | Status: AC
  Administered 2022-01-28: 1 [drp] via OPHTHALMIC
  Filled 2022-01-28: qty 15

## 2022-01-28 MED ORDER — ACETAMINOPHEN 325 MG PO TABS: 325.0000 mg | ORAL_TABLET | ORAL | Status: DC | PRN

## 2022-01-28 MED ORDER — MIDAZOLAM HCL 2 MG/2ML IJ SOLN
INTRAMUSCULAR | Status: AC
  Filled 2022-01-28: qty 2

## 2022-01-28 MED ORDER — PROPARACAINE HCL 0.5 % OP SOLN
1.0000 [drp] | OPHTHALMIC | Status: AC | PRN
  Administered 2022-01-28 (×2): 1 [drp] via OPHTHALMIC

## 2022-01-28 MED ORDER — SODIUM CHLORIDE 0.9 % IV SOLN: INTRAVENOUS | Status: DC

## 2022-01-28 MED ORDER — PROPOFOL 10 MG/ML IV BOLUS
INTRAVENOUS | Status: AC
  Filled 2022-01-28: qty 20

## 2022-01-28 MED ORDER — HYDROCODONE-ACETAMINOPHEN 5-325 MG PO TABS: 1.0000 | ORAL_TABLET | ORAL | 0 refills | Status: DC | PRN

## 2022-01-28 MED ORDER — FENTANYL CITRATE (PF) 100 MCG/2ML IJ SOLN: 25.0000 ug | INTRAMUSCULAR | Status: DC | PRN

## 2022-01-28 MED ORDER — ATROPINE SULFATE 1 % OP SOLN
OPHTHALMIC | Status: AC
  Administered 2022-01-28: 1 [drp] via OPHTHALMIC
  Filled 2022-01-28: qty 5

## 2022-01-28 MED ORDER — HYDROCODONE-ACETAMINOPHEN 5-325 MG PO TABS
ORAL_TABLET | ORAL | Status: AC
  Filled 2022-01-28: qty 1

## 2022-01-28 MED ORDER — TRIAMCINOLONE ACETONIDE 40 MG/ML IJ SUSP
INTRAMUSCULAR | Status: DC | PRN
  Administered 2022-01-28: .5 mL

## 2022-01-28 MED ORDER — PROMETHAZINE HCL 25 MG/ML IJ SOLN: 6.2500 mg | INTRAMUSCULAR | Status: DC | PRN

## 2022-01-28 MED ORDER — GATIFLOXACIN 0.5 % OP SOLN
OPHTHALMIC | Status: AC
  Filled 2022-01-28: qty 2.5

## 2022-01-28 MED ORDER — BUPIVACAINE HCL (PF) 0.75 % IJ SOLN
INTRAMUSCULAR | Status: DC | PRN
  Administered 2022-01-28: 3.5 mL

## 2022-01-28 SURGICAL SUPPLY — 44 items
BAND SCLERAL BUCKLING TYPE 41 (Ophthalmic Related) ×1 IMPLANT
BAND WRIST GAS GREEN (MISCELLANEOUS) IMPLANT
BETADINE 5% OPHTHALMIC (OPHTHALMIC) ×2 IMPLANT
BNDG EYE OVAL (GAUZE/BANDAGES/DRESSINGS) ×1 IMPLANT
CABLE BIPOLOR RESECTION CORD (MISCELLANEOUS) ×1 IMPLANT
CANNULA FLEX TIP 25G (CANNULA) ×2 IMPLANT
COVER SURGICAL LIGHT HANDLE (MISCELLANEOUS) ×2 IMPLANT
DRAPE MICROSCOPE LEICA 46X105 (MISCELLANEOUS) ×2 IMPLANT
GAS AUTO FILL CONSTEL (OPHTHALMIC) ×2
GAS AUTO FILL CONSTELLATION (OPHTHALMIC) IMPLANT
GAS WRIST BAND GREEN (MISCELLANEOUS) ×2
GLOVE SURG ENC MOIS LTX SZ7.5 (GLOVE) ×4 IMPLANT
GLOVE SURG ENC TEXT LTX SZ7 (GLOVE) ×2 IMPLANT
GOWN STRL REUS W/ TWL LRG LVL3 (GOWN DISPOSABLE) ×2 IMPLANT
GOWN STRL REUS W/ TWL XL LVL3 (GOWN DISPOSABLE) ×1 IMPLANT
GOWN STRL REUS W/TWL LRG LVL3 (GOWN DISPOSABLE) ×4
GOWN STRL REUS W/TWL XL LVL3 (GOWN DISPOSABLE) ×2
KIT BASIN OR (CUSTOM PROCEDURE TRAY) ×2 IMPLANT
KIT PERFLUORON PROCEDURE 5ML (MISCELLANEOUS) ×1 IMPLANT
NDL 18GX1X1/2 (RX/OR ONLY) (NEEDLE) ×1 IMPLANT
NDL 25GX 5/8IN NON SAFETY (NEEDLE) ×4 IMPLANT
NDL HYPO 30X.5 LL (NEEDLE) ×2 IMPLANT
NEEDLE 18GX1X1/2 (RX/OR ONLY) (NEEDLE) ×2 IMPLANT
NEEDLE 25GX 5/8IN NON SAFETY (NEEDLE) ×4 IMPLANT
NEEDLE HYPO 30X.5 LL (NEEDLE) ×4 IMPLANT
OPHTHALMIC BETADINE 5% (OPHTHALMIC) ×4
PACK VITRECTOMY CUSTOM (CUSTOM PROCEDURE TRAY) ×2 IMPLANT
PAD ARMBOARD 7.5X6 YLW CONV (MISCELLANEOUS) ×4 IMPLANT
PAK PIK VITRECTOMY CVS 25GA (OPHTHALMIC) ×2 IMPLANT
PROBE ENDO DIATHERMY 25G (MISCELLANEOUS) ×1 IMPLANT
PROBE LASER ILLUM FLEX CVD 25G (OPHTHALMIC) ×1 IMPLANT
SLEEVE SCLERAL BUCK TYPE 70 (Ophthalmic Related) ×2 IMPLANT
SUT ETHILON 5.0 S-24 (SUTURE) ×2 IMPLANT
SUT SILK 2 0 (SUTURE) ×2
SUT SILK 2 0 TIES 17X18 (SUTURE) ×2
SUT SILK 2-0 18XBRD TIE 12 (SUTURE) ×1 IMPLANT
SUT SILK 2-0 18XBRD TIE BLK (SUTURE) ×1 IMPLANT
SUT VICRYL 7 0 TG140 8 (SUTURE) ×2 IMPLANT
SYR 10ML LL (SYRINGE) ×2 IMPLANT
SYR 20ML LL LF (SYRINGE) ×1 IMPLANT
SYR 5ML LL (SYRINGE) ×2 IMPLANT
SYR TB 1ML LUER SLIP (SYRINGE) ×2 IMPLANT
TRAY FOLEY W/BAG SLVR 14FR (SET/KITS/TRAYS/PACK) ×2 IMPLANT
WATER STERILE IRR 1000ML POUR (IV SOLUTION) ×2 IMPLANT

## 2022-01-28 NOTE — Brief Op Note (Signed)
01/28/2022  3:30 PM  PATIENT:  Kerry Castillo  61 y.o. male  PRE-OPERATIVE DIAGNOSIS:  Left eye, retinal detachment  POST-OPERATIVE DIAGNOSIS:  Left eye, retinal detachment  PROCEDURE:  Procedure(s): VITRECTOMY 25 GAUGE WITH SCLERAL BUCKLE (Left) PHOTOCOAGULATION WITH LASER (Left) INSERTION OF GAS C3F8 (Left) AIR/FLUID EXCHANGE (Left)  SURGEON:  Surgeon(s) and Role:    [name], MD - Primary  ASSISTANTS: Ernest Mallick, Ophthalmic Assistant    ANESTHESIA:   local and general  EBL:  10 mL   BLOOD ADMINISTERED:none  DRAINS: none   LOCAL MEDICATIONS USED:  MARCAINE   , LIDOCAINE , and Amount: 7 ml  SPECIMEN:  No Specimen  DISPOSITION OF SPECIMEN:  N/A  COUNTS:  YES  TOURNIQUET:  * No tourniquets in log *  DICTATION: .Note written in EPIC  PLAN OF CARE: Discharge to home after PACU  PATIENT DISPOSITION:  PACU - hemodynamically stable.   Delay start of Pharmacological VTE agent (>24hrs) due to surgical blood loss or risk of bleeding: not applicable

## 2022-01-28 NOTE — Op Note (Signed)
Date of procedure: 02.02.23   Surgeon: Rennis Chris, MD, PhD   Assistant: Laurian Brim, Ophthalmic Assistant    Pre-operative Diagnosis: Macula involving Rhegmatogenous Retinal Detachment, Left Eye   Post-operative diagnosis: Macula involving Rhegmatogenous Retinal Detachment, Left Eye   Anesthesia: GETA   Procedures: 1)     Scleral Buckle, Left Eye 2)     25 gauge pars plana vitrectomy, Left Eye CPT [phone] 3)     Perfluorocarbon injection 4)     Fluid-air exchange, Left Eye 5)     Endolaser, Left Eye 5)     Injection of 14% C3F8 gas   Complications: none Estimated blood loss: minimal Specimens: none   Brief history:   The patient has a history of decreased vision in the affected left eye, and on examination, was noted to have a macula-involving retinal detachment, affecting activities of daily living.  The risks, benefits, and alternatives were explained to the patient, including pain, bleeding, infection, loss of vision, double vision, droopy eyelids, and need for more surgeries. Informed consent was obtained from the patient and placed in the chart.     Procedure:             The patient was brought to the preoperative holding area where the correct eye was confirmed and marked. The patient was then brought to the operating room where general endotracheal anesthesia was induced. A secondary time-out was performed to identify the correct patient, eyes, procedures, and any allergies. The left eye was prepped and draped in the usual sterile ophthalmic fashion followed by placement of a lid speculum.             A 360 conjunctival peritomy was created using Westcott scissors and 0.12 forceps. Each of the four quadrants between the rectus muscles was dissected using Stevens scissors to detach Tenons attachments from the globe. Each of the four rectus muscles was isolated on a muscle hook and slung using 2-0 Silk suture in the usual standard fashion. Each of the four quadrants between the  rectus muscles was inspected and there were noted to be no areas of scleral thinning. A #41 silicone band was then brought onto the field and was threaded under each rectus muscle. The band was then loosely secured using a #70 Watzke sleeve in the inferotemporal quadrant. The band was then sutured to the sclera in each quadrant using 5-0 nylon sutures passed partial thickness through the sclera in a horizontal mattress fashion. The scleral buckle was then tightened to the appropriate height with two locking needle drivers. Attention was then turned to the vitrectomy portion of the procedure.              A 25 gauge trocar was placed in the inferotemporal quadrant in a beveled fashion. A 4 mm infusion cannula was placed through this trocar, and the infusion cannula was confirmed in the vitreous cavity with no incarceration of retina or choroid prior to turning it on. Two additional 25 gauge trocars were placed in the superonasal and superotemporal quadrants (2 and 10 oclock, respectively) in a similar beveled fashion. At this time, a standard three-port pars plana vitrectomy was performed using the light pipe, the cutter, and the BIOM viewing system. A thorough anterior, core and peripheral vitreous dissection was performed. A posterior vitreous detachment was confirmed over the optic nerve. There was a temporal retinal detachment involving the macula and fovea spanning the ~0130-0700 oclock meridians. There was a single large retinal horseshoe tear at 0330 as the  source of the retinal detachment.             Traction was removed from the large retinal tear. The break were trimmed using the cutter to smooth the edges and marked with diathermy. Perfluoron was injected to push the subretinal fluid anterior to the scleral buckle. Under perfluoron, endolaser was applied over the posterior edge of the retinal tear, over the scleral buckle and just posterior to the buckle. Then, a complete fluid-air exchange was  performed with a soft tip extrusion cannula over the break, then posteriorly to remove the perfluoron. After completion of these maneuvers, the retina was flat over the macula and over the scleral buckle. Under air, endolaser was completed around the retinal tear and over and anterior to the scleral buckle to the ora.             At this time, the buckle height was confirmed and the buckle was finalized by trimming the band ends. The superotemporal trocar was removed and sutured with 7-0 vicryl in an interrupted fashion.  A complete air to 14% C3F8 gas exchange was performed through the infusion cannula and vented through the superonasal trocar using the extrusion cannula. The superonasal trocar and infusion cannula and associated trocar were then removed and sutured with 7-0 vicryl in an interrupted fashion. Kefzol + polymixin irrigation was then used over the buckle. A subtenon's block containing 0.75% marcaine and 2% lidocaine was administered.              The conjunctiva was closed with 7-0 vicryl sutures. The eye's intraocular pressure was confirmed to be at a physiologic level by digital palpation. Subconjunctival injections of Antibiotic and kenalog were administered. The lid speculum and drapes were removed. Drops of an antibiotic, antihypertensives, and steroid were given. Copious antibiotic ointment was instilled into the eye. The eye was patched and shielded. The patient tolerated the procedure well without any intraoperative or immediate postoperative complications. The patient was taken to the recovery room in good condition. The patient was instructed to maintain a strict face-down position and will be seen by Dr. Vanessa Barbara tomorrow morning in clinic.

## 2022-01-28 NOTE — Anesthesia Preprocedure Evaluation (Addendum)
Anesthesia Evaluation  Patient identified by MRN, date of birth, ID band Patient awake    Reviewed: Allergy & Precautions, NPO status , Patient's Chart, lab work & pertinent test results  Airway Mallampati: II  TM Distance: >3 FB Neck ROM: Full    Dental  (+) Teeth Intact, Dental Advisory Given   Pulmonary Current Smoker and Patient abstained from smoking.,    breath sounds clear to auscultation       Cardiovascular hypertension,  Rhythm:Regular Rate:Normal     Neuro/Psych negative neurological ROS  negative psych ROS   GI/Hepatic negative GI ROS, Neg liver ROS,   Endo/Other  negative endocrine ROS  Renal/GU Renal disease     Musculoskeletal negative musculoskeletal ROS (+)   Abdominal Normal abdominal exam  (+)   Peds  Hematology negative hematology ROS (+)   Anesthesia Other Findings   Reproductive/Obstetrics                            Anesthesia Physical Anesthesia Plan  ASA: 2  Anesthesia Plan: General   Post-op Pain Management:    Induction: Intravenous  PONV Risk Score and Plan: 2 and Ondansetron and Midazolam  Airway Management Planned: Oral ETT  Additional Equipment: None  Intra-op Plan:   Post-operative Plan: Extubation in OR  Informed Consent: I have reviewed the patients History and Physical, chart, labs and discussed the procedure including the risks, benefits and alternatives for the proposed anesthesia with the patient or authorized representative who has indicated his/her understanding and acceptance.     Dental advisory given  Plan Discussed with: CRNA  Anesthesia Plan Comments:        Anesthesia Quick Evaluation

## 2022-01-28 NOTE — Anesthesia Procedure Notes (Addendum)
Procedure Name: Intubation Date/Time: 01/28/2022 11:53 AM Performed by: Leonor Liv, CRNA Pre-anesthesia Checklist: Patient identified, Emergency Drugs available, Suction available, Patient being monitored and Timeout performed Patient Re-evaluated:Patient Re-evaluated prior to induction Oxygen Delivery Method: Circle system utilized Preoxygenation: Pre-oxygenation with 100% oxygen Induction Type: IV induction Ventilation: Mask ventilation without difficulty Laryngoscope Size: Mac and 4 Grade View: Grade II Tube size: 7.5 mm Number of attempts: 1 Airway Equipment and Method: Stylet Placement Confirmation: ETT inserted through vocal cords under direct vision, positive ETCO2 and breath sounds checked- equal and bilateral Secured at: 23 cm Tube secured with: Tape Dental Injury: Teeth and Oropharynx as per pre-operative assessment

## 2022-01-28 NOTE — Interval H&P Note (Signed)
History and Physical Interval Note:  01/28/2022 11:26 AM  Kerry Castillo  has presented today for surgery, with the diagnosis of left eye, retinal detachment.  The various methods of treatment have been discussed with the patient and family. After consideration of risks, benefits and other options for treatment, the patient has consented to  Procedure(s): VITRECTOMY 25 GAUGE WITH SCLERAL BUCKLE (Left) as a surgical intervention.  The patient's history has been reviewed, patient examined, no change in status, stable for surgery.  I have reviewed the patient's chart and labs.  Questions were answered to the patient's satisfaction.     [name]

## 2022-01-28 NOTE — Discharge Instructions (Signed)
POSTOPERATIVE INSTRUCTIONS  Your doctor has performed vitreoretinal surgery on you at [facility].  - Keep eye patched and shielded until seen by Dr. Noeli Lavery 8 AM tomorrow in clinic - Do not use drops until return - FACE DOWN POSITIONING WHILE AWAKE - Sleep with belly down or on right side, avoid laying flat on back.    - No strenuous bending, stooping or lifting.  - You may not drive until further notice.  - If your doctor used a gas bubble in your eye during the procedure he will advise you on postoperative positioning. If you have a gas bubble you will be wearing a green bracelet that was applied in the operating room. The green bracelet should stay on as long as the gas bubble is in your eye. While the gas bubble is present you should not fly in an airplane. If you require general anesthesia while the gas bubble is present you must notify your anesthesiologist that an intraocular gas bubble is present so he can take the appropriate precautions.  - Tylenol or any other over-the-counter pain reliever can be used according to your doctor. If more pain medicine is required, your doctor will have a prescription for you.  - You may read, go up and down stairs, and watch television.     Glynna Failla, M.D., Ph.D.  

## 2022-01-28 NOTE — Transfer of Care (Signed)
Immediate Anesthesia Transfer of Care Note  Patient: Kerry Castillo  Procedure(s) Performed: VITRECTOMY 25 GAUGE WITH SCLERAL BUCKLE (Left: Eye) PHOTOCOAGULATION WITH LASER (Left: Eye) INSERTION OF GAS C3F8 (Left: Eye) AIR/FLUID EXCHANGE (Left: Eye)  Patient Location: PACU  Anesthesia Type:General  Level of Consciousness: awake, drowsy, patient cooperative and responds to stimulation  Airway & Oxygen Therapy: Patient Spontanous Breathing and Patient connected to nasal cannula oxygen  Post-op Assessment: Report given to RN, Post -op Vital signs reviewed and stable, Patient moving all extremities X 4 and Patient able to stick tongue midline  Post vital signs: Reviewed  Last Vitals:  Vitals Value Taken Time  BP 137/86   Temp 98.5   Pulse 90   Resp 15   SpO2 100     Last Pain:  Vitals:   01/28/22 0933  TempSrc:   PainSc: 1       Patients Stated Pain Goal: 0 (01/28/22 0933)  Complications: No notable events documented.

## 2022-01-29 ENCOUNTER — Encounter (HOSPITAL_COMMUNITY): Payer: Self-pay | Admitting: Ophthalmology

## 2022-01-29 ENCOUNTER — Ambulatory Visit (INDEPENDENT_AMBULATORY_CARE_PROVIDER_SITE_OTHER): Payer: BC Managed Care – PPO | Admitting: Ophthalmology

## 2022-01-29 DIAGNOSIS — I10 Essential (primary) hypertension: Secondary | ICD-10-CM

## 2022-01-29 DIAGNOSIS — H3322 Serous retinal detachment, left eye: Secondary | ICD-10-CM

## 2022-01-29 DIAGNOSIS — H35033 Hypertensive retinopathy, bilateral: Secondary | ICD-10-CM

## 2022-01-29 DIAGNOSIS — H2513 Age-related nuclear cataract, bilateral: Secondary | ICD-10-CM

## 2022-01-31 NOTE — Anesthesia Postprocedure Evaluation (Signed)
Anesthesia Post Note  Patient: Kerry Castillo  Procedure(s) Performed: VITRECTOMY 25 GAUGE WITH SCLERAL BUCKLE (Left: Eye) PHOTOCOAGULATION WITH LASER (Left: Eye) INSERTION OF GAS C3F8 (Left: Eye) AIR/FLUID EXCHANGE (Left: Eye)     Patient location during evaluation: PACU Anesthesia Type: General Level of consciousness: awake and alert Pain management: pain level controlled Vital Signs Assessment: post-procedure vital signs reviewed and stable Respiratory status: spontaneous breathing, nonlabored ventilation, respiratory function stable and patient connected to nasal cannula oxygen Cardiovascular status: stable and blood pressure returned to baseline Postop Assessment: no apparent nausea or vomiting Anesthetic complications: no   No notable events documented.  Last Vitals:  Vitals:   01/28/22 1548 01/28/22 1601  BP: (!) 135/99 (!) 139/99  Pulse: 95 87  Resp: 15 17  Temp:  (!) 36.3 C  SpO2: 95% 95%    Last Pain:  Vitals:   01/28/22 1601  TempSrc:   PainSc: 6                  Trenton Verne

## 2022-02-01 ENCOUNTER — Encounter (INDEPENDENT_AMBULATORY_CARE_PROVIDER_SITE_OTHER): Payer: Self-pay | Admitting: Ophthalmology
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
## 2022-02-04 ENCOUNTER — Encounter (INDEPENDENT_AMBULATORY_CARE_PROVIDER_SITE_OTHER): Payer: Self-pay | Admitting: Ophthalmology

## 2022-02-04 ENCOUNTER — Other Ambulatory Visit: Payer: Self-pay

## 2022-02-04 ENCOUNTER — Ambulatory Visit (INDEPENDENT_AMBULATORY_CARE_PROVIDER_SITE_OTHER): Payer: BC Managed Care – PPO | Admitting: Ophthalmology

## 2022-02-04 DIAGNOSIS — I10 Essential (primary) hypertension: Secondary | ICD-10-CM

## 2022-02-04 DIAGNOSIS — H2513 Age-related nuclear cataract, bilateral: Secondary | ICD-10-CM

## 2022-02-04 DIAGNOSIS — H3322 Serous retinal detachment, left eye: Secondary | ICD-10-CM

## 2022-02-04 DIAGNOSIS — H35033 Hypertensive retinopathy, bilateral: Secondary | ICD-10-CM

## 2022-02-04 MED ORDER — BACITRACIN-POLYMYXIN B 500-10000 UNIT/GM OP OINT: TOPICAL_OINTMENT | Freq: Four times a day (QID) | OPHTHALMIC | 3 refills | Status: AC

## 2022-02-04 MED ORDER — PREDNISOLONE ACETATE 1 % OP SUSP: 1.0000 [drp] | Freq: Four times a day (QID) | OPHTHALMIC | 0 refills | Status: AC
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
## 2022-02-25 ENCOUNTER — Ambulatory Visit (INDEPENDENT_AMBULATORY_CARE_PROVIDER_SITE_OTHER): Payer: BC Managed Care – PPO | Admitting: Ophthalmology

## 2022-02-25 ENCOUNTER — Encounter (INDEPENDENT_AMBULATORY_CARE_PROVIDER_SITE_OTHER): Payer: Self-pay | Admitting: Ophthalmology

## 2022-02-25 ENCOUNTER — Other Ambulatory Visit: Payer: Self-pay

## 2022-02-25 DIAGNOSIS — H2513 Age-related nuclear cataract, bilateral: Secondary | ICD-10-CM

## 2022-02-25 DIAGNOSIS — H35033 Hypertensive retinopathy, bilateral: Secondary | ICD-10-CM | POA: Diagnosis not present

## 2022-02-25 DIAGNOSIS — I10 Essential (primary) hypertension: Secondary | ICD-10-CM

## 2022-02-25 DIAGNOSIS — H3322 Serous retinal detachment, left eye: Secondary | ICD-10-CM

## 2022-02-26 ENCOUNTER — Encounter (INDEPENDENT_AMBULATORY_CARE_PROVIDER_SITE_OTHER): Payer: Self-pay | Admitting: Ophthalmology
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
## 2022-03-19 ENCOUNTER — Other Ambulatory Visit: Payer: Self-pay

## 2022-03-19 ENCOUNTER — Ambulatory Visit (INDEPENDENT_AMBULATORY_CARE_PROVIDER_SITE_OTHER): Payer: BC Managed Care – PPO | Admitting: Ophthalmology

## 2022-03-19 ENCOUNTER — Encounter (INDEPENDENT_AMBULATORY_CARE_PROVIDER_SITE_OTHER): Payer: Self-pay | Admitting: Ophthalmology

## 2022-03-19 DIAGNOSIS — H35033 Hypertensive retinopathy, bilateral: Secondary | ICD-10-CM | POA: Diagnosis not present

## 2022-03-19 DIAGNOSIS — I10 Essential (primary) hypertension: Secondary | ICD-10-CM | POA: Diagnosis not present

## 2022-03-19 DIAGNOSIS — H2513 Age-related nuclear cataract, bilateral: Secondary | ICD-10-CM

## 2022-03-19 DIAGNOSIS — H3322 Serous retinal detachment, left eye: Secondary | ICD-10-CM

## 2022-03-20 ENCOUNTER — Encounter (INDEPENDENT_AMBULATORY_CARE_PROVIDER_SITE_OTHER): Payer: Self-pay | Admitting: Ophthalmology
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
## 2022-04-09 ENCOUNTER — Encounter (INDEPENDENT_AMBULATORY_CARE_PROVIDER_SITE_OTHER): Payer: Self-pay | Admitting: Ophthalmology

## 2022-04-09 ENCOUNTER — Ambulatory Visit (INDEPENDENT_AMBULATORY_CARE_PROVIDER_SITE_OTHER): Payer: BC Managed Care – PPO | Admitting: Ophthalmology

## 2022-04-09 DIAGNOSIS — H10013 Acute follicular conjunctivitis, bilateral: Secondary | ICD-10-CM | POA: Diagnosis not present

## 2022-04-09 DIAGNOSIS — H3322 Serous retinal detachment, left eye: Secondary | ICD-10-CM

## 2022-04-09 DIAGNOSIS — H35033 Hypertensive retinopathy, bilateral: Secondary | ICD-10-CM

## 2022-04-09 DIAGNOSIS — H35372 Puckering of macula, left eye: Secondary | ICD-10-CM

## 2022-04-09 DIAGNOSIS — I10 Essential (primary) hypertension: Secondary | ICD-10-CM | POA: Diagnosis not present

## 2022-04-09 DIAGNOSIS — H2513 Age-related nuclear cataract, bilateral: Secondary | ICD-10-CM

## 2022-04-12 ENCOUNTER — Encounter (INDEPENDENT_AMBULATORY_CARE_PROVIDER_SITE_OTHER): Payer: Self-pay | Admitting: Ophthalmology

## 2022-07-09 ENCOUNTER — Encounter (INDEPENDENT_AMBULATORY_CARE_PROVIDER_SITE_OTHER): Payer: BC Managed Care – PPO | Admitting: Ophthalmology

## 2022-07-09 DIAGNOSIS — H35033 Hypertensive retinopathy, bilateral: Secondary | ICD-10-CM

## 2022-07-09 DIAGNOSIS — I10 Essential (primary) hypertension: Secondary | ICD-10-CM

## 2022-07-09 DIAGNOSIS — H35372 Puckering of macula, left eye: Secondary | ICD-10-CM

## 2022-07-09 DIAGNOSIS — H2513 Age-related nuclear cataract, bilateral: Secondary | ICD-10-CM

## 2022-07-09 DIAGNOSIS — H3322 Serous retinal detachment, left eye: Secondary | ICD-10-CM

## 2022-07-16 ENCOUNTER — Encounter (INDEPENDENT_AMBULATORY_CARE_PROVIDER_SITE_OTHER): Payer: BC Managed Care – PPO | Admitting: Ophthalmology

## 2022-07-16 ENCOUNTER — Encounter (INDEPENDENT_AMBULATORY_CARE_PROVIDER_SITE_OTHER): Payer: Self-pay | Admitting: Ophthalmology

## 2022-07-16 ENCOUNTER — Ambulatory Visit (INDEPENDENT_AMBULATORY_CARE_PROVIDER_SITE_OTHER): Payer: BC Managed Care – PPO | Admitting: Ophthalmology

## 2022-07-16 DIAGNOSIS — H3322 Serous retinal detachment, left eye: Secondary | ICD-10-CM

## 2022-07-16 DIAGNOSIS — H35033 Hypertensive retinopathy, bilateral: Secondary | ICD-10-CM | POA: Diagnosis not present

## 2022-07-16 DIAGNOSIS — I10 Essential (primary) hypertension: Secondary | ICD-10-CM

## 2022-07-16 DIAGNOSIS — H35372 Puckering of macula, left eye: Secondary | ICD-10-CM | POA: Diagnosis not present

## 2022-07-16 DIAGNOSIS — H26493 Other secondary cataract, bilateral: Secondary | ICD-10-CM

## 2022-07-16 DIAGNOSIS — H2513 Age-related nuclear cataract, bilateral: Secondary | ICD-10-CM
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
## 2022-07-19 ENCOUNTER — Encounter (INDEPENDENT_AMBULATORY_CARE_PROVIDER_SITE_OTHER): Payer: Self-pay | Admitting: Ophthalmology

## 2022-09-06 ENCOUNTER — Ambulatory Visit: Payer: BC Managed Care – PPO | Admitting: Urology

## 2022-09-06 ENCOUNTER — Encounter: Payer: Self-pay | Admitting: Urology

## 2022-09-06 VITALS — BP 113/69 | HR 96

## 2022-09-06 DIAGNOSIS — N200 Calculus of kidney: Secondary | ICD-10-CM | POA: Diagnosis not present

## 2022-09-06 DIAGNOSIS — R109 Unspecified abdominal pain: Secondary | ICD-10-CM

## 2022-09-06 DIAGNOSIS — R310 Gross hematuria: Secondary | ICD-10-CM

## 2022-09-06 LAB — MICROSCOPIC EXAMINATION
Bacteria, UA: NONE SEEN
Epithelial Cells (non renal): NONE SEEN /hpf (ref 0–10)
RBC, Urine: >30 /hpf — AB (ref 0–2)
Renal Epithel, UA: NONE SEEN /hpf
WBC, UA: NONE SEEN /hpf (ref 0–5)

## 2022-09-06 LAB — URINALYSIS, ROUTINE W REFLEX MICROSCOPIC
Bilirubin, UA: NEGATIVE
Glucose, UA: NEGATIVE
Nitrite, UA: POSITIVE — AB
Specific Gravity, UA: 1.02 (ref 1.005–1.030)
Urobilinogen, Ur: 1 mg/dL (ref 0.2–1.0)
pH, UA: 5.5 (ref 5.0–7.5)

## 2022-09-06 MED ORDER — ONDANSETRON HCL 4 MG PO TABS: 4.0000 mg | ORAL_TABLET | Freq: Three times a day (TID) | ORAL | 0 refills | Status: AC | PRN

## 2022-09-06 MED ORDER — TAMSULOSIN HCL 0.4 MG PO CAPS: 0.4000 mg | ORAL_CAPSULE | Freq: Every day | ORAL | 11 refills | Status: AC

## 2022-09-06 MED ORDER — HYDROCODONE-ACETAMINOPHEN 10-325 MG PO TABS: 1.0000 | ORAL_TABLET | Freq: Four times a day (QID) | ORAL | 0 refills | Status: AC | PRN
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
## 2022-09-06 NOTE — Patient Instructions (Signed)

## 2022-09-08 LAB — CYTOLOGY, URINE

## 2022-09-16 ENCOUNTER — Ambulatory Visit: Payer: BC Managed Care – PPO | Admitting: Physician Assistant

## 2022-09-21 NOTE — Progress Notes (Signed)
Letter sent.

## 2022-09-24 ENCOUNTER — Other Ambulatory Visit: Payer: Self-pay | Admitting: Physician Assistant

## 2022-09-24 DIAGNOSIS — R109 Unspecified abdominal pain: Secondary | ICD-10-CM

## 2022-09-25 IMAGING — DX DG ABDOMEN 1V
3 series · 3 of 3 positions shown · non-contrast
Comparison: 11/14/2008

CLINICAL DATA: Hematuria, left flank pain

EXAM:
ABDOMEN - 1 VIEW

[abdomen kub (1 of 3)]
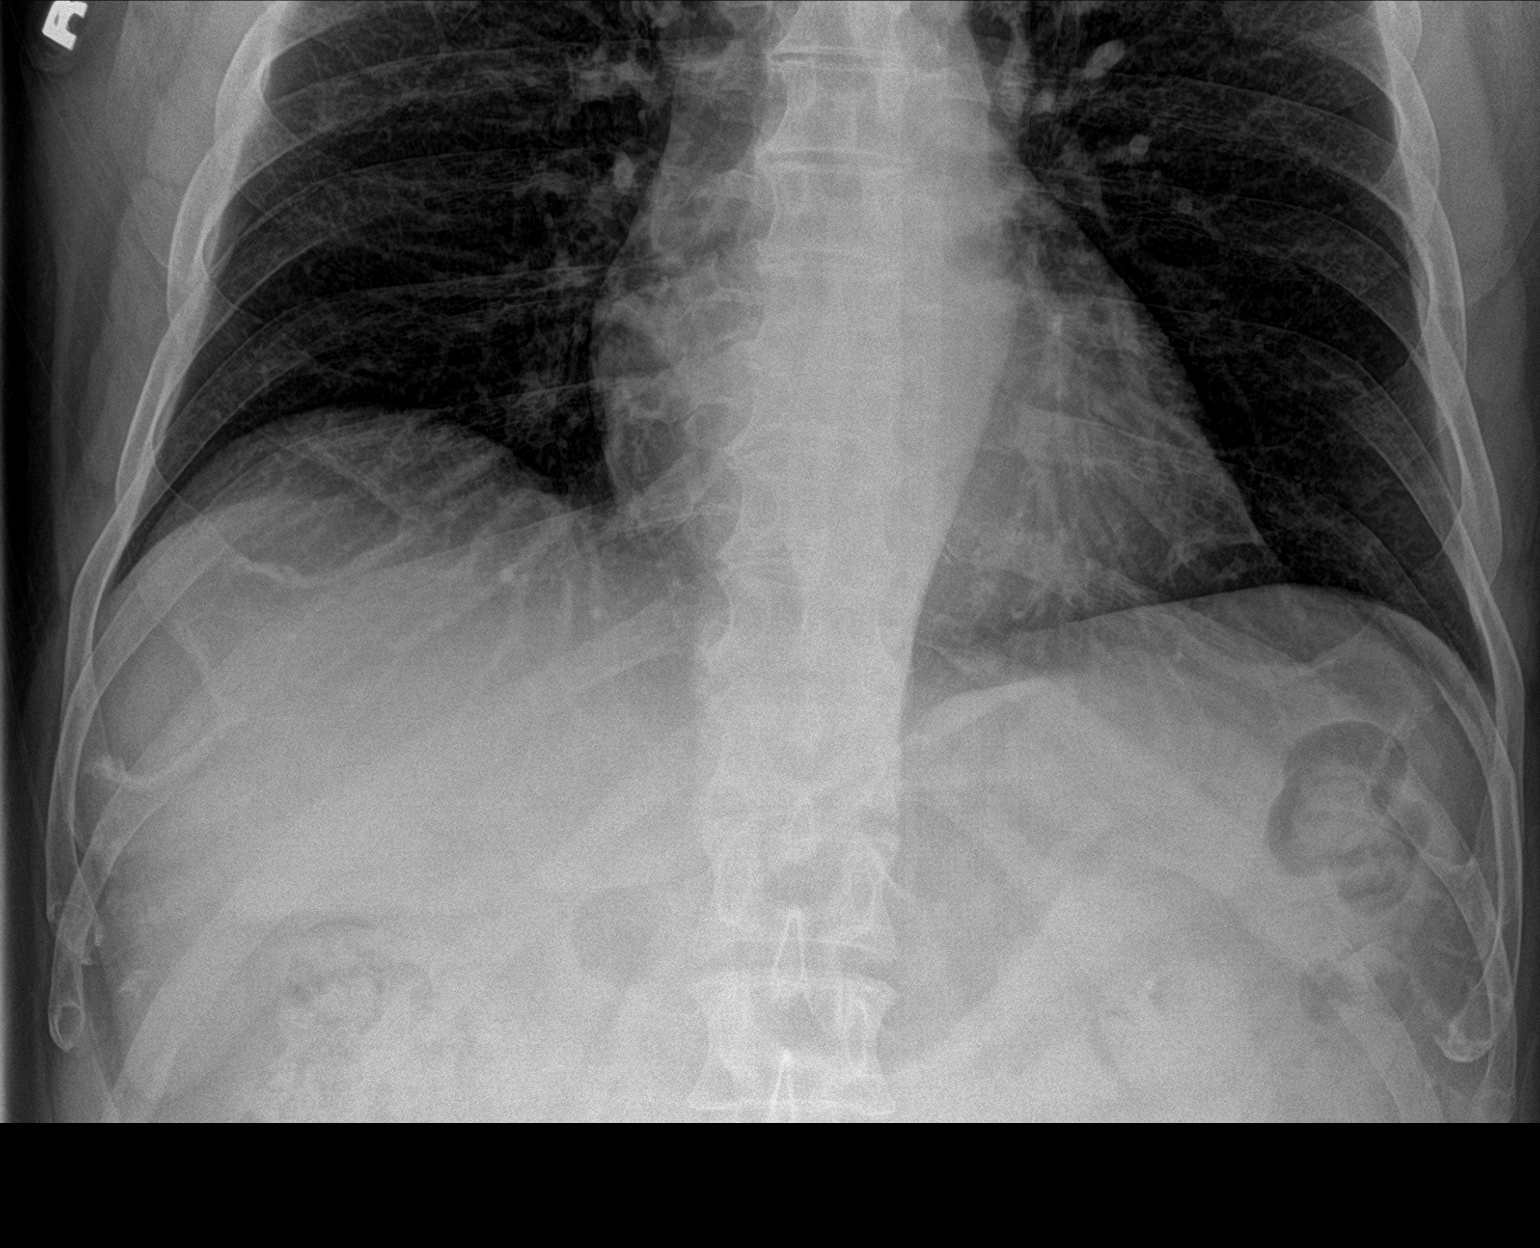

[abdomen kub (2 of 3)]
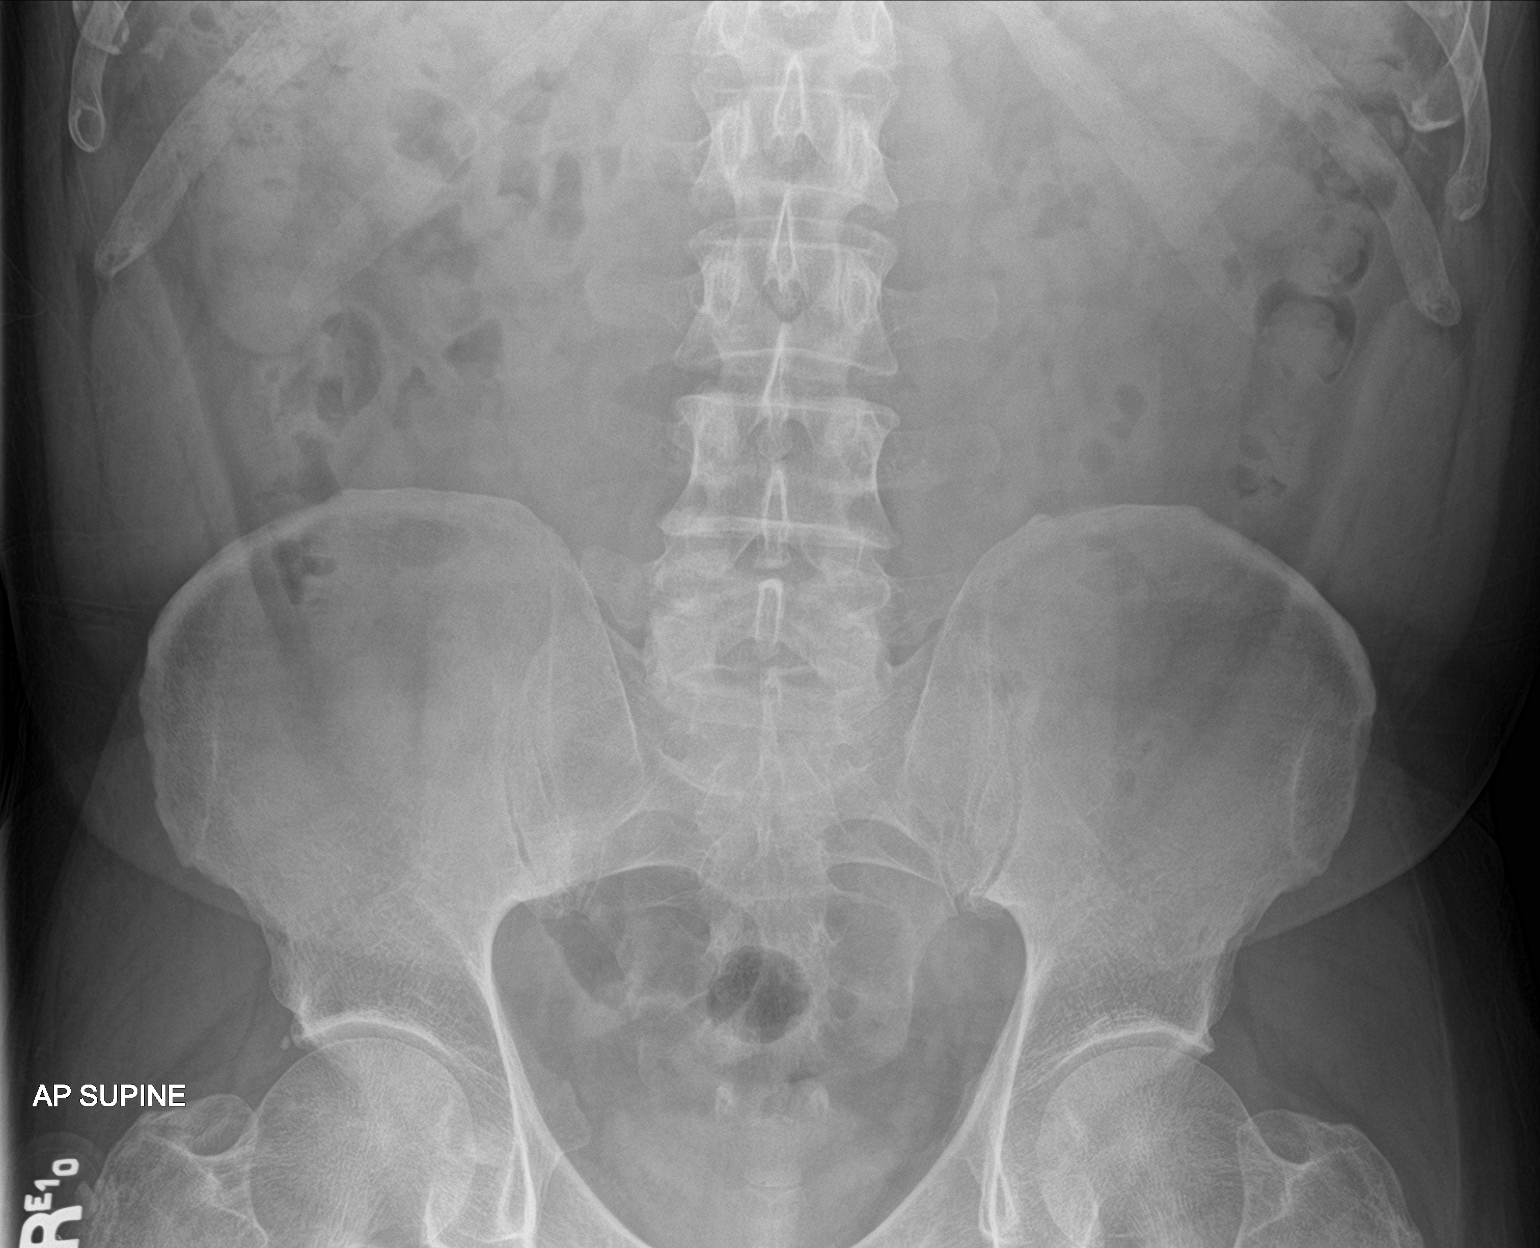

[abdomen kub (3 of 3)]
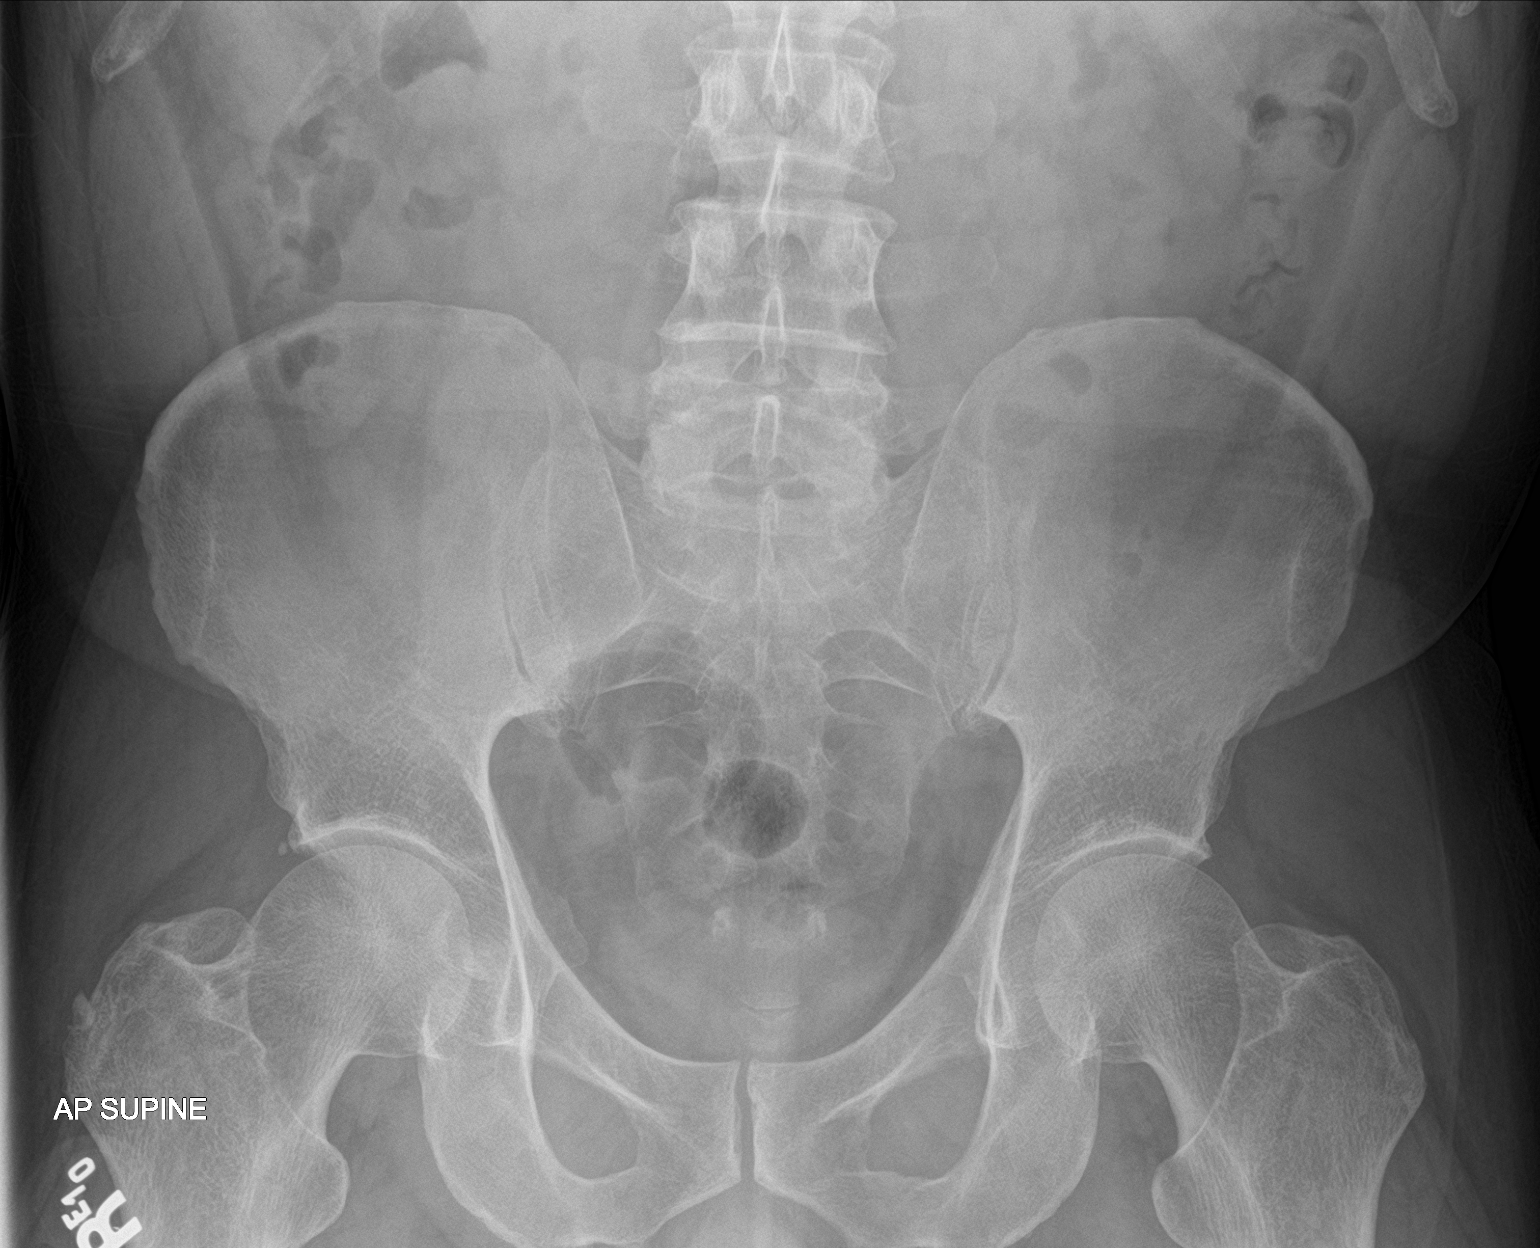

[3 of 3 positions shown; findings below may reference images not displayed]

FINDINGS: 3 supine frontal views of the abdomen and pelvis are obtained. Bowel
gas pattern is unremarkable without obstruction or ileus. No masses
or abnormal calcifications. Lung bases are clear. No acute bony
abnormalities.
IMPRESSION: 1. Unremarkable exam. No evidence of radiopaque urinary tract
calculi.

## 2023-04-20 ENCOUNTER — Encounter (INDEPENDENT_AMBULATORY_CARE_PROVIDER_SITE_OTHER): Payer: BC Managed Care – PPO | Admitting: Ophthalmology

## 2023-04-20 DIAGNOSIS — I10 Essential (primary) hypertension: Secondary | ICD-10-CM

## 2023-04-20 DIAGNOSIS — H35033 Hypertensive retinopathy, bilateral: Secondary | ICD-10-CM

## 2023-04-20 DIAGNOSIS — H26493 Other secondary cataract, bilateral: Secondary | ICD-10-CM

## 2023-04-20 DIAGNOSIS — H35372 Puckering of macula, left eye: Secondary | ICD-10-CM

## 2023-04-20 DIAGNOSIS — H3322 Serous retinal detachment, left eye: Secondary | ICD-10-CM
